# Patient Record
Sex: Female | Born: 1940 | Race: White | Hispanic: No | Marital: Married | Smoking: Never smoker
Health system: Southern US, Community
[De-identification: ages and names within clinical notes are randomized; demographics above are authoritative.]

## PROBLEM LIST (undated history)

## (undated) DIAGNOSIS — E039 Hypothyroidism, unspecified: Secondary | ICD-10-CM

## (undated) DIAGNOSIS — I10 Essential (primary) hypertension: Secondary | ICD-10-CM

## (undated) DIAGNOSIS — K9089 Other intestinal malabsorption: Secondary | ICD-10-CM

## (undated) DIAGNOSIS — R413 Other amnesia: Secondary | ICD-10-CM

## (undated) DIAGNOSIS — E079 Disorder of thyroid, unspecified: Secondary | ICD-10-CM

## (undated) DIAGNOSIS — Z1371 Encounter for nonprocreative screening for genetic disease carrier status: Secondary | ICD-10-CM

## (undated) HISTORY — PX: ABDOMINAL HYSTERECTOMY: SHX81

## (undated) HISTORY — PX: COLONOSCOPY: SHX174

## (undated) HISTORY — DX: Other intestinal malabsorption: K90.89

## (undated) HISTORY — DX: Disorder of thyroid, unspecified: E07.9

## (undated) HISTORY — DX: Essential (primary) hypertension: I10

## (undated) HISTORY — DX: Other amnesia: R41.3

---

## 1997-11-22 ENCOUNTER — Encounter: Payer: Self-pay | Admitting: Obstetrics and Gynecology

## 1997-11-22 ENCOUNTER — Ambulatory Visit (HOSPITAL_COMMUNITY): Admission: RE | Admit: 1997-11-22 | Discharge: 1997-11-22 | Payer: Self-pay | Admitting: Obstetrics and Gynecology

## 1998-11-25 ENCOUNTER — Encounter: Payer: Self-pay | Admitting: Family Medicine

## 1998-11-25 ENCOUNTER — Ambulatory Visit (HOSPITAL_COMMUNITY): Admission: RE | Admit: 1998-11-25 | Discharge: 1998-11-25 | Payer: Self-pay | Admitting: Family Medicine

## 2000-01-04 ENCOUNTER — Ambulatory Visit (HOSPITAL_COMMUNITY): Admission: RE | Admit: 2000-01-04 | Discharge: 2000-01-04 | Payer: Self-pay | Admitting: Obstetrics and Gynecology

## 2000-01-04 ENCOUNTER — Encounter: Payer: Self-pay | Admitting: Internal Medicine

## 2000-01-08 ENCOUNTER — Encounter: Payer: Self-pay | Admitting: Internal Medicine

## 2000-01-08 ENCOUNTER — Encounter: Admission: RE | Admit: 2000-01-08 | Discharge: 2000-01-08 | Payer: Self-pay | Admitting: Family Medicine

## 2000-04-06 ENCOUNTER — Other Ambulatory Visit: Admission: RE | Admit: 2000-04-06 | Discharge: 2000-04-06 | Payer: Self-pay | Admitting: Obstetrics and Gynecology

## 2000-06-10 ENCOUNTER — Encounter: Admission: RE | Admit: 2000-06-10 | Discharge: 2000-06-10 | Payer: Self-pay | Admitting: Obstetrics and Gynecology

## 2000-06-10 ENCOUNTER — Encounter: Payer: Self-pay | Admitting: Obstetrics and Gynecology

## 2001-01-09 ENCOUNTER — Encounter: Payer: Self-pay | Admitting: Internal Medicine

## 2001-01-09 ENCOUNTER — Ambulatory Visit (HOSPITAL_COMMUNITY): Admission: RE | Admit: 2001-01-09 | Discharge: 2001-01-09 | Payer: Self-pay | Admitting: Internal Medicine

## 2001-06-30 ENCOUNTER — Other Ambulatory Visit: Admission: RE | Admit: 2001-06-30 | Discharge: 2001-06-30 | Payer: Self-pay | Admitting: Obstetrics and Gynecology

## 2002-03-06 ENCOUNTER — Encounter: Payer: Self-pay | Admitting: Obstetrics and Gynecology

## 2002-03-06 ENCOUNTER — Ambulatory Visit (HOSPITAL_COMMUNITY): Admission: RE | Admit: 2002-03-06 | Discharge: 2002-03-06 | Payer: Self-pay | Admitting: Obstetrics and Gynecology

## 2003-03-08 ENCOUNTER — Ambulatory Visit (HOSPITAL_COMMUNITY): Admission: RE | Admit: 2003-03-08 | Discharge: 2003-03-08 | Payer: Self-pay | Admitting: Gastroenterology

## 2003-04-01 ENCOUNTER — Ambulatory Visit (HOSPITAL_COMMUNITY): Admission: RE | Admit: 2003-04-01 | Discharge: 2003-04-01 | Payer: Self-pay | Admitting: Internal Medicine

## 2004-04-13 ENCOUNTER — Ambulatory Visit (HOSPITAL_COMMUNITY): Admission: RE | Admit: 2004-04-13 | Discharge: 2004-04-13 | Payer: Self-pay | Admitting: Internal Medicine

## 2005-04-20 ENCOUNTER — Ambulatory Visit (HOSPITAL_COMMUNITY): Admission: RE | Admit: 2005-04-20 | Discharge: 2005-04-20 | Payer: Self-pay | Admitting: Obstetrics and Gynecology

## 2006-02-08 ENCOUNTER — Ambulatory Visit (HOSPITAL_BASED_OUTPATIENT_CLINIC_OR_DEPARTMENT_OTHER): Admission: RE | Admit: 2006-02-08 | Discharge: 2006-02-08 | Payer: Self-pay | Admitting: Orthopedic Surgery

## 2006-05-04 ENCOUNTER — Ambulatory Visit: Payer: Self-pay | Admitting: Pulmonary Disease

## 2006-05-25 ENCOUNTER — Ambulatory Visit (HOSPITAL_COMMUNITY): Admission: RE | Admit: 2006-05-25 | Discharge: 2006-05-25 | Payer: Self-pay | Admitting: Obstetrics and Gynecology

## 2006-06-14 ENCOUNTER — Ambulatory Visit: Payer: Self-pay | Admitting: Pulmonary Disease

## 2007-06-21 ENCOUNTER — Ambulatory Visit (HOSPITAL_COMMUNITY): Admission: RE | Admit: 2007-06-21 | Discharge: 2007-06-21 | Payer: Self-pay | Admitting: Obstetrics and Gynecology

## 2007-07-13 ENCOUNTER — Encounter: Admission: RE | Admit: 2007-07-13 | Discharge: 2007-07-13 | Payer: Self-pay | Admitting: Obstetrics and Gynecology

## 2007-08-22 ENCOUNTER — Encounter: Payer: Self-pay | Admitting: Pulmonary Disease

## 2007-12-12 ENCOUNTER — Telehealth (INDEPENDENT_AMBULATORY_CARE_PROVIDER_SITE_OTHER): Payer: Self-pay | Admitting: *Deleted

## 2007-12-13 ENCOUNTER — Telehealth (INDEPENDENT_AMBULATORY_CARE_PROVIDER_SITE_OTHER): Payer: Self-pay | Admitting: *Deleted

## 2007-12-22 DIAGNOSIS — E039 Hypothyroidism, unspecified: Secondary | ICD-10-CM | POA: Insufficient documentation

## 2007-12-22 DIAGNOSIS — J309 Allergic rhinitis, unspecified: Secondary | ICD-10-CM | POA: Insufficient documentation

## 2007-12-22 DIAGNOSIS — I10 Essential (primary) hypertension: Secondary | ICD-10-CM | POA: Insufficient documentation

## 2007-12-25 ENCOUNTER — Ambulatory Visit: Payer: Self-pay | Admitting: Pulmonary Disease

## 2007-12-25 DIAGNOSIS — J45909 Unspecified asthma, uncomplicated: Secondary | ICD-10-CM | POA: Insufficient documentation

## 2008-03-25 ENCOUNTER — Encounter: Payer: Self-pay | Admitting: Pulmonary Disease

## 2008-07-22 ENCOUNTER — Ambulatory Visit (HOSPITAL_COMMUNITY): Admission: RE | Admit: 2008-07-22 | Discharge: 2008-07-22 | Payer: Self-pay | Admitting: Obstetrics and Gynecology

## 2009-10-27 ENCOUNTER — Telehealth (INDEPENDENT_AMBULATORY_CARE_PROVIDER_SITE_OTHER): Payer: Self-pay | Admitting: *Deleted

## 2009-11-04 ENCOUNTER — Ambulatory Visit: Payer: Self-pay | Admitting: Pulmonary Disease

## 2009-11-04 DIAGNOSIS — F341 Dysthymic disorder: Secondary | ICD-10-CM | POA: Insufficient documentation

## 2009-11-11 ENCOUNTER — Telehealth (INDEPENDENT_AMBULATORY_CARE_PROVIDER_SITE_OTHER): Payer: Self-pay | Admitting: *Deleted

## 2010-02-15 ENCOUNTER — Encounter: Payer: Self-pay | Admitting: Obstetrics and Gynecology

## 2010-02-26 NOTE — Assessment & Plan Note (Signed)
Summary: cough/ok per Alexis Donovan/mg   CC:  last ov  12-25-2007--cough that started out as a head cold---lots of congestion that sits in her throat and she has to clear her throat all the time--cough is mostly dry.  History of Present Illness: 70 y/o WF whom we have seen in the past for Asthma/ RADS... she is the wife of Alexis Donovan... her primary physician is DrGates...    ~  EAV40:  I saw her May08 and she was stable on Symbicort 80- 2 sp Bid, Mucinex 1-2 tabs Bid, Tussionex Prn... she had just finished a brief course of Prednisone for an asthma exac... over the last year she has been pretty good about taking her Symbicort- usually 2 sp Bid but decreases it to 1 sp/d in the summer months when she is free from URIs, allergens, etc...    Presents w/ a several week hx of increasing congestion, wheezing off and on, some cough - all assoc w/ the cooler wetter weather... exposed to grandchildren w/ colds etc... DrGates added ALLEGRA 180 to her regimen and this has helped some...  We discussed a round of Medrol to reduce her airway inflamm and refill meds...   ~  November 04, 2009:  add-on appt at her request w/ hx URI about 1 month ago w/ head congestion, drainage, cough, & phlegm in throat- irritating;   treated w/ Amoxicillin & sl better but c/o persistant inflammatory symptoms & w/ her hx of RADS she prob needs brief course Prednisone for resolution... she hasn't had a CXR in some time, she says- so we will check one for completeness... also notes intermittent recurrent throat symptoms- "I lose my voice" & we discussed MMW for Prn use...    Current Problems:   ALLERGIC RHINITIS (ICD-477.9) INTRINSIC ASTHMA, UNSPECIFIED (ICD-493.10)  ~  baseline CXR= I don't have a baseline CXR on her- she indicated that routine CXR at [facility] office was "clear & WNL"...  ~  prev PFT's 4/08 showed FVC= 2.75 (84%), FEV1= 1.93 (74%), %1sec= 70, mid-flows= 46%... all c/w  mild airflow obstruction/ small airways disease...  ~  CXR here 10/11 showed   HYPERTENSION (ICD-401.9) - on BENICAR 20mg /d per DrGates...  HYPOTHYROIDISM (ICD-244.9) - on SYNTHROID 22mcg/d per DrGates  ANXIETY DEPRESSION (ICD-300.4) - she notes incr family stress & DrGates has placed her on LEXAPRO 10mg /d,  CLONAZEPAM 0.5mg  Prn, & AMBIEN 10mg  Qhs Prn...   Preventive Screening-Counseling & Management  Alcohol-Tobacco     Smoking Status: quit     Year Quit: she smoked a few years in early 102s...  Allergies (verified): No Known Drug Allergies  Comments:  Nurse/Medical Assistant: The patient's medications and allergies were reviewed with the patient and were updated in the Medication and Allergy Lists.  Past History:  Past Medical History: ALLERGIC RHINITIS (ICD-477.9) INTRINSIC ASTHMA, UNSPECIFIED (ICD-493.10) HYPERTENSION (ICD-401.9) HYPOTHYROIDISM (ICD-244.9) ANXIETY DEPRESSION (ICD-300.4)  Past Surgical History: S/P hysterectomy S/P left hand surg 1/08 by DrSypher  Family History: Reviewed history and no changes required.  Social History: Reviewed history and no changes required.  Review of Systems      See HPI       The patient complains of prolonged cough.  The patient denies anorexia, fever, weight loss, weight gain, vision loss, decreased hearing, hoarseness, chest pain, syncope, dyspnea on exertion, peripheral edema, headaches, hemoptysis, abdominal pain, melena, hematochezia, severe indigestion/heartburn, hematuria, incontinence, muscle weakness, suspicious skin lesions, transient blindness, difficulty walking, depression, unusual weight change, abnormal bleeding, enlarged lymph nodes, and angioedema.  Vital Signs:  Patient profile:   70 year old female Height:      67 inches Weight:      166.38 pounds BMI:     26.15 O2 Sat:      97 % on Room air Temp:     97.5 degrees F oral Pulse rate:   79 / minute BP sitting:   118 / 76  (left arm) Cuff size:   regular  Vitals Entered By: Randell Loop CMA  (November 04, 2009 3:11 PM)  O2 Sat at Rest %:  97 O2 Flow:  Room air CC: last ov  12-25-2007--cough that started out as a head cold---lots of congestion that sits in her throat and she has to clear her throat all the time--cough is mostly dry Is Patient Diabetic? No Pain Assessment Patient in pain? no      Comments meds updated today with pt   Physical Exam  Additional Exam:  WD, WN, 70 y/o WF in NAD... GENERAL:  Alert & oriented; pleasant & cooperative... HEENT:  /AT, EOM-wnl, PERRLA, EACs-clear, TMs-wnl, NOSE- sl congested, THROAT-clear & wnl. NECK:  Supple w/ fairROM; no JVD; normal carotid impulses w/o bruits; no thyromegaly or nodules palpated; no lymphadenopathy. CHEST:  Clear to P & A; scat end-exp wheezes; no rales or rhonchi... HEART:  Regular Rhythm; prominent P2, gr 1/6 SEM without rubs or gallops detected... ABDOMEN:  Soft & nontender; normal bowel sounds; no organomegaly or masses palpated... EXT: without deformities or arthritic changes; no varicose veins/ venous insuffic/ or edema. NEURO:  CN's intact; no focal neuro deficits... DERM:  No lesions noted; no rash etc...    CXR  Procedure date:  11/04/2009  Findings:      CHEST - 2 VIEW Comparison: None.   Findings: Trachea is midline.  Heart is at the upper limits of normal in size.  Minimal biapical pleural thickening.  Lungs are clear.  No pleural fluid.   IMPRESSION: No acute findings.   Read By:  Reyes Ivan.,  M.D.  Impression & Recommendations:  Problem # 1:  INTRINSIC ASTHMA, UNSPECIFIED (ICD-493.10) She has RADS/ Asthma & her main trigger appears to be her infreq URI's... we discussed Rx w/ DepoMedrol, & Prednisone taper for the residual airway inflammation... plus she will try the MMW for intermittent thoat symptoms she has... Her updated medication list for this problem includes:    Symbicort 80-4.5 Mcg/act Aero (Budesonide-formoterol fumarate) ..... Inhale 2 puffs two times a day     Proventil Hfa 108 (90 Base) Mcg/act Aers (Albuterol sulfate) .Marland Kitchen... 1-2 puffs every 4-6 hours as needed    Prednisone 20 Mg Tabs (Prednisone) .Marland Kitchen... Take 1 tab by mouth two times a day x3days, then 1tab daily x3days, then 1/2 tab daily x3days, then 1/2 tab every other day til gone...  Orders: T-2 View CXR (71020TC) >> clear lungs...  Problem # 2:  HYPERTENSION (ICD-401.9) Controlled on med from [facility]... Her updated medication list for this problem includes:    Benicar 20 Mg Tabs (Olmesartan medoxomil) .Marland Kitchen... Take 1 tab daily as directed by drgates  Problem # 3:  HYPOTHYROIDISM (ICD-244.9) Clinically euthyroid on the Synthroid dose from DrGates... Her updated medication list for this problem includes:    Synthroid 88 Mcg Tabs (Levothyroxine sodium) .Marland Kitchen... Take 1 tablet by mouth once a day  Problem # 4:  ANXIETY DEPRESSION (ICD-300.4) Currently using Lexapro, Clonazepam, & Ambien as directed by DrGates...  Complete Medication List: 1)  Allegra 180 Mg Tabs (Fexofenadine hcl) .Marland KitchenMarland KitchenMarland Kitchen  Take 1 tab by mouth once daily for allergies.Marland KitchenMarland Kitchen 2)  Symbicort 80-4.5 Mcg/act Aero (Budesonide-formoterol fumarate) .... Inhale 2 puffs two times a day 3)  Proventil Hfa 108 (90 Base) Mcg/act Aers (Albuterol sulfate) .Marland Kitchen.. 1-2 puffs every 4-6 hours as needed 4)  Benicar 20 Mg Tabs (Olmesartan medoxomil) .... Take 1 tab daily as directed by drgates 5)  Synthroid 88 Mcg Tabs (Levothyroxine sodium) .... Take 1 tablet by mouth once a day 6)  Premarin 0.3 Mg Tabs (Estrogens conjugated) .... Take as directed by gyn... 7)  Lexapro 10 Mg Tabs (Escitalopram oxalate) .... Take 1 tablet by mouth once a day 8)  Clonazepam 0.5 Mg Tabs (Clonazepam) .... Take 1/2 tablet by mouth at bedtime 9)  Ambien 10 Mg Tabs (Zolpidem tartrate) .... Take 1/2 tabet by mouth at bedtime 10)  Prednisone 20 Mg Tabs (Prednisone) .... Take 1 tab by mouth two times a day x3days, then 1tab daily x3days, then 1/2 tab daily x3days, then 1/2 tab every other  day til gone... 11)  Magic Mouthwash  .... 1 tsp gargle & swallow up to 4 times daily as needed...  Other Orders: Depo- Medrol 80mg  (J1040) Depo- Medrol 40mg  (J1030) Admin of Therapeutic Inj  intramuscular or subcutaneous (96045)  Patient Instructions: 1)  Today we updated your med list- see below.... 2)  We decided to treat your airway inflammation w/ a Depo shot & a course of Prednisone- follow the directions on the bottle.Marland KitchenMarland Kitchen 3)  We also wrote a new perscription for MAGIC MOUTHWASH- 1 tsp gargle & swallow up to 4 times daily as needed for the throat symptoms.Marland KitchenMarland Kitchen 4)  You may also benefit from [facility] 1-2 two times a day w/ plenty of fluids for any congestion & thick mucous.Marland KitchenMarland Kitchen 5)  Today we did a CXR to be sure there are no abnormalities here- please call the "phone tree" for your results.Marland KitchenMarland Kitchen  6)  Call for any questions... Prescriptions: MAGIC MOUTHWASH 1 tsp gargle & swallow up to 4 times daily as needed...  #4 oz x prn   Entered and Authorized by:   Michele Mcalpine MD   Signed by:   Michele Mcalpine MD on 11/04/2009   Method used:   Print then Give to Patient   RxID:   [id number] PREDNISONE 20 MG TABS (PREDNISONE) take 1 tab by mouth two times a day x3days, then 1tab daily x3days, then 1/2 tab daily x3days, then 1/2 tab every other day til gone...  #12 x 0   Entered and Authorized by:   Michele Mcalpine MD   Signed by:   Michele Mcalpine MD on 11/04/2009   Method used:   Print then Give to Patient   RxID:   [id number]    Immunization History:  Influenza Immunization History:    Influenza:  historical (11/01/2009)   Medication Administration  Injection # 1:    Medication: Depo- Medrol 80mg     Diagnosis: ALLERGIC RHINITIS (ICD-477.9)    Route: IM    Site: LUOQ gluteus    Exp Date: 04/2012    Lot #: [id number]    Mfr: Pharmacia    Patient tolerated injection without complications    Given by: Randell Loop CMA (November 04, 2009 4:18 PM)  Injection # 2:    Medication: Depo-  Medrol 40mg     Diagnosis: ALLERGIC RHINITIS (ICD-477.9)    Route: IM    Site: LUOQ gluteus    Exp Date: 04/2012    Lot #: [id number]  Mfr: Pharmacia    Patient tolerated injection without complications    Given by: Randell Loop CMA (November 04, 2009 4:18 PM)  Orders Added: 1)  Est. Patient Level IV [29562] 2)  T-2 View CXR [71020TC] 3)  Depo- Medrol 80mg  [J1040] 4)  Depo- Medrol 40mg  [J1030] 5)  Admin of Therapeutic Inj  intramuscular or subcutaneous [13086]

## 2010-02-26 NOTE — Progress Notes (Signed)
Summary: RESULTS of cxr-----on phone tree  Phone Note Call from Patient   Caller: Patient Call For: NADEL Summary of Call: PT CALLING FOR CHEST XRAY RESULTS Initial call taken by: Rickard Patience,  November 11, 2009 10:34 AM  Follow-up for Phone Call        per append to cxr results from 11/05/2009, results are on phone tree.  called and spoke with pt and informed her of the above information and transferred pt to the phone tree line. Arman Filter LPN  November 11, 2009 10:58 AM

## 2010-02-26 NOTE — Progress Notes (Signed)
Summary: work in [employer] Note Call from Patient   Caller: pt Summary of Call: Pt is requesting an appt with Dr. Kriste Basque sooner then the available December appts.  Pt last seen by Dr. Kriste Basque 11-2007. 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 Thanks.  Initial call taken by: Carron Curie CMA,  October 27, 2009 10:06 AM  Follow-up for Phone Call        need to know if the pt is sick or if something else is going on please. thanks Randell Loop CMA  October 27, 2009 10:08 AM      Additional Follow-up for Phone Call Additional follow up Details #1::        Spoke with pt's spouse.  He states that pt wants to be seen sooner for cough that has bothered her for 4 wks- already txed with amoxcicillin and still not improved.  She wants appt pls advise Additional Follow-up by: Vernie Murders,  October 27, 2009 10:55 AM    Additional Follow-up for Phone Call Additional follow up Details #2::    we can add pt on for 10-10 at 10:30    thanks Randell Loop CMA  October 27, 2009 11:49 AM   Spoke with pt's spouse and notified of appt date and time- appt sched. Follow-up by: Vernie Murders,  October 27, 2009 11:59 AM

## 2010-02-26 NOTE — Progress Notes (Signed)
Summary: Alexis Donovan ANOTHER APPT DATE/ WORK-IN  Phone Note Call from Patient Call back at [facility] Phone [phone]   Caller: Patient Call For: NADEL Summary of Call: PT CAN'T COME 10/10 (WILL BE AT [facility]). CALL TO GIVE HER ANOTHER APPT DATE. (COUGH) SEE LAST EMR MSG Initial call taken by: Tivis Ringer, CNA,  October 27, 2009 12:12 PM  Follow-up for Phone Call        we can change her app to  10-11 at 3pm  this would be the earliest appt aval.  or she could have Dr. Kevan Ny refer her to a pulmonary doc and possibly get her in sooner.  thanks Randell Loop CMA  October 27, 2009 12:16 PM \\par   called and spoke with pt.  pt scheduled to see SN 11/04/2009 at 3pm. Arman Filter LPN  October 27, 2009 12:21 PM

## 2010-06-12 NOTE — Op Note (Signed)
[name], [name]              ACCOUNT NO.:  [id number]   MEDICAL RECORD NO.:  [id number]          PATIENT TYPE:  AMB   LOCATION:  DSC                          FACILITY:  [facility]   PHYSICIAN:  Katy Fitch. Sypher, M.D. 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   OPERATION:  Release of left thumb A1 pulley.   SURGEON:  Josephine Igo, M.D.   ASSISTANT:  Molly Maduro Dasnoit PA-C.   ANESTHESIA:  Is general sedation and 2% lidocaine metacarpal head level  block of left thumb, supervising anesthesiologist is Dr. Krista Blue.   INDICATIONS:  Alexis Donovan is a 70 year old woman who self-referred  for management of a locking left trigger thumb.   Clinical examination revealed signs of stenosing tenosynovitis at the  left thumb A1 pulley.  She is not responding to nonoperative measures,  therefore she presents for release of her left thumb A1 pulley at this  time.   DESCRIPTION OF PROCEDURE:  Alexis Donovan is brought to the operating  room and placed in supine position on the table.   Following light sedation left arm was prepped with Betadine soap  solution, sterilely draped.  A pneumatic tourniquet was applied to  proximal left brachium.  Following exsanguination left arm with Esmarch  bandage arterial tourniquet inflated to 250 mmHg.  Procedure commenced  with a short transverse scission directly over the thickened A1 pulley.  The subcutaneous tissues were carefully divided taking care to identify  and retract the radial proper digital nerve.  Pulley was split with  scalpel and scissors and the tendon delivered.  Thereafter full active  range of motion of the IP joint was recovered.   No mass or other predicaments were noted.   The  wound was repaired with mattress suture of 5-0 nylon.  A compressive  dressing was applied with Xeroflo, sterile gauze, and Ace wrap.      Katy Fitch Sypher, M.D.  Electronically Signed     RVS/MEDQ  D:  02/08/2006  T:  02/09/2006  Job:  [id number]   cc:   Candyce Churn, M.D.

## 2011-01-06 ENCOUNTER — Other Ambulatory Visit (HOSPITAL_COMMUNITY): Payer: Self-pay | Admitting: Obstetrics and Gynecology

## 2011-01-06 DIAGNOSIS — Z13820 Encounter for screening for osteoporosis: Secondary | ICD-10-CM

## 2011-01-06 DIAGNOSIS — Z1231 Encounter for screening mammogram for malignant neoplasm of breast: Secondary | ICD-10-CM

## 2011-02-17 ENCOUNTER — Ambulatory Visit (HOSPITAL_COMMUNITY)
Admission: RE | Admit: 2011-02-17 | Discharge: 2011-02-17 | Disposition: A | Discharge status: Home / Self Care | Payer: Medicare Other | Source: Ambulatory Visit | Attending: Obstetrics and Gynecology | Admitting: Obstetrics and Gynecology

## 2011-02-17 DIAGNOSIS — Z13820 Encounter for screening for osteoporosis: Secondary | ICD-10-CM

## 2011-02-17 DIAGNOSIS — Z1231 Encounter for screening mammogram for malignant neoplasm of breast: Secondary | ICD-10-CM

## 2011-02-23 ENCOUNTER — Other Ambulatory Visit: Payer: Self-pay | Admitting: Obstetrics and Gynecology

## 2011-02-23 DIAGNOSIS — R928 Other abnormal and inconclusive findings on diagnostic imaging of breast: Secondary | ICD-10-CM

## 2011-03-05 ENCOUNTER — Ambulatory Visit
Admission: RE | Admit: 2011-03-05 | Discharge: 2011-03-05 | Disposition: A | Discharge status: Home / Self Care | Payer: No Typology Code available for payment source | Source: Ambulatory Visit | Attending: Obstetrics and Gynecology | Admitting: Obstetrics and Gynecology

## 2011-03-05 DIAGNOSIS — R928 Other abnormal and inconclusive findings on diagnostic imaging of breast: Secondary | ICD-10-CM

## 2011-03-27 ENCOUNTER — Telehealth: Payer: Self-pay

## 2011-03-27 ENCOUNTER — Ambulatory Visit (INDEPENDENT_AMBULATORY_CARE_PROVIDER_SITE_OTHER): Payer: Medicare Other | Admitting: Internal Medicine

## 2011-03-27 VITALS — BP 108/67 | HR 74 | Temp 98.5°F | Resp 18 | Ht 67.5 in | Wt 163.0 lb

## 2011-03-27 DIAGNOSIS — J209 Acute bronchitis, unspecified: Secondary | ICD-10-CM

## 2011-03-27 DIAGNOSIS — J040 Acute laryngitis: Secondary | ICD-10-CM

## 2011-03-27 MED ORDER — AZITHROMYCIN 250 MG PO TABS: ORAL_TABLET | ORAL | Status: AC

## 2011-03-27 MED ORDER — PREDNISONE 20 MG PO TABS: ORAL_TABLET | ORAL | Status: DC

## 2011-03-27 NOTE — Progress Notes (Signed)
  Subjective:    Patient ID: Alexis Donovan, female    DOB: Mar 07, 1940, 71 y.o.   MRN: [id number]  HPIPresents with a one-week history of congestion that has progressed to laryngitis over the last 4 days with a cough that is day and night and is somewhat painful. She has had past history of bronchitis with reactive airway disease. There is no current fever. She is currently stressed by her daughter's admission to the hospital with metastatic breast disease at age 70. There is no sore throat but there is a lot of congestion and mucus deep in the pharynx.    Review of SystemsNo new symptoms related to her current problem list     Objective:   Physical Exam  Vital signs are stable Pupils are equal round and reactive to light TMs are clear The nares are clear Oropharynx clear There are no anterior cervical nodes or thyromegaly The voice is very hoarse The lungs are clear to auscultation and there is very mild wheezing and forced expiration       Assessment & Plan:  Problem #1 laryngitis Problem #2 LRI with reactive airway disease  Z-Pak 250 Prednisone 40 mg daily for 3 days OTC cough meds Followup in 5 days if not much improved

## 2011-03-27 NOTE — Telephone Encounter (Signed)
Patient called because she saw Dr. Merla Riches this am. Rx was sent to [facility] on [address], but it was a Z-pac. Pt thought she was getting Prednisone and another antibiotic but got a Zpac instead. Wanted to let us know that in the past when she has taken a z-pac, she feels better for a couple of days, then bad after that. Would like to see if there is something else that can be called in today.

## 2011-03-28 NOTE — Telephone Encounter (Signed)
Pt called again to check on status of message.

## 2011-03-29 NOTE — Telephone Encounter (Signed)
See chart Prednisone was prescribed And zpak was what I intended

## 2011-03-30 NOTE — Telephone Encounter (Signed)
LMOM to CB. 

## 2011-03-31 NOTE — Telephone Encounter (Signed)
LMOM to CB if she still has questions about her Rxs

## 2011-04-01 NOTE — Telephone Encounter (Signed)
Spoke with pt who reports that she did finish the pred and z-pack, and her voice has improved, although she is not well yet. She stated she thinks MD had given Abx to prevent bronchitis that she is susceptible to getting, and it has not progressed to that. She will CB if Sxs persist of certainly if it worsens.

## 2012-04-10 ENCOUNTER — Ambulatory Visit (INDEPENDENT_AMBULATORY_CARE_PROVIDER_SITE_OTHER): Payer: Medicare Other | Admitting: Emergency Medicine

## 2012-04-10 ENCOUNTER — Ambulatory Visit: Payer: Medicare Other

## 2012-04-10 VITALS — BP 124/74 | HR 72 | Temp 98.4°F | Resp 18 | Ht 68.0 in | Wt 172.0 lb

## 2012-04-10 DIAGNOSIS — S93609A Unspecified sprain of unspecified foot, initial encounter: Secondary | ICD-10-CM

## 2012-04-10 DIAGNOSIS — S93602A Unspecified sprain of left foot, initial encounter: Secondary | ICD-10-CM

## 2012-04-10 DIAGNOSIS — S82409A Unspecified fracture of shaft of unspecified fibula, initial encounter for closed fracture: Secondary | ICD-10-CM

## 2012-04-10 DIAGNOSIS — W102XXA Fall (on)(from) incline, initial encounter: Secondary | ICD-10-CM

## 2012-04-10 DIAGNOSIS — S82402A Unspecified fracture of shaft of left fibula, initial encounter for closed fracture: Secondary | ICD-10-CM

## 2012-04-10 DIAGNOSIS — M25579 Pain in unspecified ankle and joints of unspecified foot: Secondary | ICD-10-CM

## 2012-04-10 NOTE — Progress Notes (Signed)
  Subjective:    Patient ID: Alexis Donovan, female    DOB: December 31, 1940, 72 y.o.   MRN: [id number]  HPI patient was in good health until Sunday when while walking down a hill she suffered an inversion injury to right ankle. She has been able to ambulate but has noted significant swelling over the fibula    Review of Systems     Objective:   Physical Exam there is tenderness and swelling over the distal fibula. There is mild tenderness over the deltoid.   [facility] reading (PRIMARY) by  Dr. Cleta Alberts spiral fracture distal fibula.        Assessment & Plan:     Patient has spiral fracture the distal fibula. She will be placed in a cam boot and on crutches follow up with orthopedist  Fitted and trained for crutches. Also applied camwalker boot.

## 2012-06-22 ENCOUNTER — Other Ambulatory Visit (HOSPITAL_COMMUNITY): Payer: Self-pay | Admitting: Internal Medicine

## 2012-06-22 DIAGNOSIS — Z1231 Encounter for screening mammogram for malignant neoplasm of breast: Secondary | ICD-10-CM

## 2012-06-26 ENCOUNTER — Ambulatory Visit (HOSPITAL_COMMUNITY)
Admission: RE | Admit: 2012-06-26 | Discharge: 2012-06-26 | Disposition: A | Discharge status: Home / Self Care | Payer: Medicare Other | Source: Ambulatory Visit | Attending: Internal Medicine | Admitting: Internal Medicine

## 2012-06-26 DIAGNOSIS — Z1231 Encounter for screening mammogram for malignant neoplasm of breast: Secondary | ICD-10-CM | POA: Insufficient documentation

## 2013-08-06 ENCOUNTER — Telehealth: Payer: Self-pay | Admitting: Pulmonary Disease

## 2013-08-06 NOTE — Telephone Encounter (Signed)
Pt requesting appt Pt c/o wheezing, congestion, cough Denies fever Pt states that this is not a "sick" issue, this is something recurrent in her lungs   Using Symbicort as directed. (followed by Dr Inda Merlin PCP)  Pt last seen by [facility] 2011 Aware that she would be classified as a new patient   Pt states that she spoke with PCP Dr Inda Merlin regarding these issues and he advised her to see an Allergist as he could not do anything more for her for these symptoms Pt refused appt with Allergist stating this she wanted to be seen by Pulmonologist as this is something to do with her lungs. She also stated that if this were something dealing with her allergies she would make this appt but it is not so she requests to see SN asap  Aware that SN not in office until 08/07/13 Pt requests leave detailed message on home #  Please advise Dr Lenna Gilford. Thanks.   We will have to have this approved by Dr Lenna Gilford before we can schedule an appt d/t SN not taking new patients at this time.

## 2013-08-06 NOTE — Telephone Encounter (Signed)
Pt called back stating that she is taking a Claritin every day and does not feel this is allergies.

## 2013-08-06 NOTE — Telephone Encounter (Signed)
Pt added on as Consult 08/10/13 at 12pm Will send to [facility] as FYI Nothing further needed.

## 2013-08-06 NOTE — Telephone Encounter (Signed)
Per SN: okay to add on for appt for pulm wed, thur, or Friday when available

## 2013-08-10 ENCOUNTER — Encounter (INDEPENDENT_AMBULATORY_CARE_PROVIDER_SITE_OTHER): Payer: Self-pay

## 2013-08-10 ENCOUNTER — Encounter: Payer: Self-pay | Admitting: Pulmonary Disease

## 2013-08-10 ENCOUNTER — Ambulatory Visit (INDEPENDENT_AMBULATORY_CARE_PROVIDER_SITE_OTHER): Payer: Medicare Other | Admitting: Pulmonary Disease

## 2013-08-10 ENCOUNTER — Ambulatory Visit (INDEPENDENT_AMBULATORY_CARE_PROVIDER_SITE_OTHER)
Admission: RE | Admit: 2013-08-10 | Discharge: 2013-08-10 | Disposition: A | Discharge status: Home / Self Care | Payer: Medicare Other | Source: Ambulatory Visit | Attending: Pulmonary Disease | Admitting: Pulmonary Disease

## 2013-08-10 VITALS — BP 124/82 | HR 72 | Temp 97.4°F | Ht 68.0 in | Wt 165.4 lb

## 2013-08-10 DIAGNOSIS — J45909 Unspecified asthma, uncomplicated: Secondary | ICD-10-CM

## 2013-08-10 DIAGNOSIS — J683 Other acute and subacute respiratory conditions due to chemicals, gases, fumes and vapors: Secondary | ICD-10-CM

## 2013-08-10 DIAGNOSIS — F341 Dysthymic disorder: Secondary | ICD-10-CM

## 2013-08-10 DIAGNOSIS — I10 Essential (primary) hypertension: Secondary | ICD-10-CM

## 2013-08-10 DIAGNOSIS — E039 Hypothyroidism, unspecified: Secondary | ICD-10-CM

## 2013-08-10 MED ORDER — PANTOPRAZOLE SODIUM 40 MG PO TBEC: 40.0000 mg | DELAYED_RELEASE_TABLET | Freq: Every day | ORAL | Status: DC

## 2013-08-10 MED ORDER — BUDESONIDE-FORMOTEROL FUMARATE 160-4.5 MCG/ACT IN AERO: 2.0000 | INHALATION_SPRAY | Freq: Two times a day (BID) | RESPIRATORY_TRACT | Status: AC

## 2013-08-10 MED ORDER — PREDNISONE 20 MG PO TABS: ORAL_TABLET | ORAL | Status: DC

## 2013-08-10 MED ORDER — METHYLPREDNISOLONE ACETATE 80 MG/ML IJ SUSP
80.0000 mg | Freq: Once | INTRAMUSCULAR | Status: AC
  Administered 2013-08-10: 80 mg via INTRAMUSCULAR

## 2013-08-10 NOTE — Patient Instructions (Signed)
Today we updated your med list in our EPIC system...    Continue your current medications the same...  Today we discussed your reactive airways disease and checked a CXR & PFT...    We will contact you w/ the results when available...   We discussed your triggers and it seems like silent reflux may be a significant factor causing your throat & resp symptoms...  In this regard- we want to start a vigorous ANTIREFLUX regimen>>    Start PROTONIX (Pantoprazole) 40mg  tabs- take one tab about 30 min before the evening meal...    DO NOT EAT OR DRINK MUCH AFTER DINNER IN THE EVE (this allows your upper GI tract to empty out before bedtime).    Elevate the head of your bed on 6" blocks (this is in fact a very important step)  For your airway inflammation>>    We are going to treat you with a short course of Prednisone 20mg  tabs>>       Start tomorrow AM w/ one tab twice daily for 4 days...       Then decr to one tab each AM for 4 days...        Then decr to 1/2 tab each AM for 4 days...       Then decr to 1/2 tab every other day til gone...  We are also going to increase your Symbicort to the 160 dose inhaler- and use it as 2 inhalations twice daily on a regular dosing schedule for now...  You may continue the OTC MUCNEX 600mg  tabs- 1to2 tabs twice daily w/ fluids...    And you may use the OTC DELSYM- 2 tsp twice daily as needed for cough...  Call for any questions...  Let's plan a follow up visit in 50mo, sooner if needed for problems.Marland KitchenMarland Kitchen

## 2013-08-10 NOTE — Progress Notes (Signed)
Subjective:     Patient ID: Alexis Donovan, female   DOB: 09-22-1940, 73 y.o.   MRN: [id number]  HPI 73 y/o WF whom we have seen in the past for Asthma/ RADS... she is the wife of Camdyn Laden... her primary physician is DrGates...   ~  [id number]:  I saw her May08 and she was stable on Symbicort 80- 2 sp Bid, Mucinex 1-2 tabs Bid, Tussionex Prn... she had just finished a brief course of Prednisone for an asthma exac... over the last year she has been pretty good about taking her Symbicort- usually 2 sp Bid but decreases it to 1 sp/d in the summer months when she is free from URIs, allergens, etc...    Presents w/ a several week hx of increasing congestion, wheezing off and on, some cough - all assoc w/ the cooler wetter weather... exposed to grandchildren w/ colds etc... DrGates added ALLEGRA 180 to her regimen and this has helped some...  We discussed a round of Medrol to reduce her airway inflamm and refill meds...  ~  November 04, 2009:  add-on appt at her request w/ hx URI about 1 month ago w/ head congestion, drainage, cough, & phlegm in throat- irritating;   treated w/ Amoxicillin & sl better but c/o persistant inflammatory symptoms & w/ her hx of RADS she prob needs brief course Prednisone for resolution... she hasn't had a CXR in some time, she says- so we will check one for completeness... also notes intermittent recurrent throat symptoms- "I lose my voice" & we discussed MMW for Prn use...  ~  August 10, 2013:  41yr ROV & Nitzia called requesting a new appt for persistent resp symptoms> she relates the onset of this prob back last Sept when she had a typical URI/ bronchitis that was refractory to management & required several appts w/ DrGates and left her w/ intermittent cough, beige sput, chest congestion, irritated throat & hoarseness; this has waxed & waned up to the present but she notes "I'm not sick" and "I breathe fine thru my nose" but notes the intermit congestion, cough, hoarseness, etc; she  notes a dry cough at night and notes occas cough after eating but specifically denies heartburn, acid reflux, etc... Exam showed scat bilat rhonchi & chest congestion;  We decided to check PFT & CXR;  We reviewed the following medical problems during today's office visit >>     RADS, Asthma> she has both Symbicort 80 & 160 but not using either one regularly "just as needed for flair-ups"; as above- she has a number of upper airway inflamm symptoms...    HBP> on BenicarHCT 40-25; BP= 124/82 7 similar at home she says; denies HA, visual sx, CP, palpit, SOB, edema...    Hypothyroid> on Synthroid112; followed by [facility] and she is clinically euthyroid on this dose...    Anxiety/ Depression> followed by DrGates on Lexapro10-1/2, Klonopin0.5prn, Ambien10-1/2... We reviewed prob list, meds, xrays and labs> see below for updates >>   CXR 7/15 showed norm heart size, sl tortuous Ao, clear lungs, NAD...   PFT 7/15 revealed FVC=2.67 (80%); FEV1=1.89 (75%), %1sec=71; mid-flows=56% predicted... 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 In this regard we will treat the airway inflamm process w/ Depo80, Pred20mg - 4d tapering sched, Symbicort160-2spBid, along w/ prn Mucinex, Fluids, Delsym;  For the reflux component we discussed a vigorous antireflux regimen- Protonix40 taken 12min before the eve meal, NPO after dinner, elev HOB 6" blocks... We will recheck her in 18mo...            Problem List:  Her Primary physician is DrGates...  ALLERGIC RHINITIS  (ICD-477.9) INTRINSIC ASTHMA, UNSPECIFIED (ICD-493.10) REACTIVE AIRWAYS DYSFUNCTION SYNDROME >>  ~  baseline CXR= I don't have a baseline CXR on her- she indicated that routine CXR at [facility] office was "clear & WNL"... ~  prev PFT's 4/08 showed FVC= 2.75 (84%), FEV1= 1.93 (74%), %1sec= 70, mid-flows= 46%... all c/w  mild airflow obstruction/ small airways disease... ~  CXR here 10/11 showed borderline heart size, min apical pleural thickening, clear, NAD.Marland Kitchen.  ~  7/15: she present ed w/ chronic persistent airway inflamm symptoms (as above); treated w/ Depo/ Pred, Symbicort160, and vigorous antireflux regimen...   HYPERTENSION (ICD-401.9) - on BenicarHCT 40-25; BP= 124/82 7 similar at home she says; denies HA, visual sx, CP, palpit, SOB, edema...  HYPOTHYROIDISM (ICD-244.9) - on Synthroid112; followed by DrGates and she is clinically euthyroid on this dose...  ANXIETY DEPRESSION (ICD-300.4) - followed by DrGates on Lexapro10-1/2, Klonopin0.5prn, Ambien10-1/2...   Past Surgical History  Procedure Laterality Date  . Abdominal hysterectomy      Family History  Problem Relation Age of Onset  . Heart disease Mother   . Heart disease Father   . Heart disease Maternal Grandfather   . Heart disease Paternal Grandfather   . Cancer Daughter     breast    History   Social History  . Marital Status: Married    Spouse Name: N/A    Number of Children: N/A  . Years of Education: N/A   Occupational History  . Not on file.   Social History Main Topics  . Smoking status: Never Smoker   . Smokeless tobacco: Not on file  . Alcohol Use: Yes  . Drug Use: No  . Sexual Activity: Yes   Other Topics Concern  . Not on file   Social History Narrative  . No narrative on file    Outpatient Encounter Prescriptions as of 08/10/2013  Medication Sig  . clonazePAM (KLONOPIN) 0.5 MG tablet Take 0.5 mg by mouth as needed for anxiety.  Marland Kitchen escitalopram (LEXAPRO) 10 MG tablet Take 5 mg by mouth daily.    Marland Kitchen levothyroxine (SYNTHROID, LEVOTHROID) 112 MCG tablet Take 112 mcg by mouth daily.  . Multiple Vitamin (MULTIVITAMIN) tablet Take 1 tablet by mouth daily.  Marland Kitchen olmesartan-hydrochlorothiazide (BENICAR HCT) 40-25 MG per tablet Take 1 tablet by mouth daily.  Marland Kitchen zolpidem (AMBIEN) 10 MG tablet Take 5 mg by mouth at bedtime as needed.  . budesonide-formoterol (SYMBICORT) 160-4.5 MCG/ACT inhaler Inhale 2 puffs into the lungs 2 (two) times daily.  Marland Kitchen estrogens, conjugated, (PREMARIN) 0.3 MG tablet Take 0.3 mg by mouth daily. Take daily for 21 days then do not take for 7 days.  . pantoprazole (PROTONIX) 40 MG tablet Take 1 tablet (40 mg total) by mouth daily. 30 minutes before the evening meal  . predniSONE (DELTASONE) 20 MG tablet Take as directed    No Known Allergies   Current Medications, Allergies, Past Medical History, Past Surgical History,  Family History, and Social History were reviewed in [employer] record.   Review of Systems         See HPI      The patient complains of prolonged cough.  The patient denies anorexia, fever, weight loss, weight gain, vision loss, decreased hearing, hoarseness, chest pain, syncope, dyspnea on exertion, peripheral edema, headaches, hemoptysis, abdominal pain, melena, hematochezia, severe indigestion/heartburn, hematuria, incontinence, muscle weakness, suspicious skin lesions, transient blindness, difficulty walking, depression, unusual weight change, abnormal bleeding, enlarged lymph nodes, and angioedema.     Objective:   Physical Exam    WD, WN, 73 y/o WF in NAD... GENERAL:  Alert & oriented; pleasant & cooperative... HEENT:  [place]/AT, EOM-wnl, PERRLA, EACs-clear, TMs-wnl, NOSE- sl congested, THROAT-clear & wnl. NECK:  Supple w/ fairROM; no JVD; normal carotid impulses w/o bruits; no thyromegaly or nodules palpated; no lymphadenopathy. CHEST:  Clear to P & A; scat end-exp wheezes; no rales or rhonchi... HEART:  Regular Rhythm;  prominent P2, gr 1/6 SEM without rubs or gallops detected... ABDOMEN:  Soft & nontender; normal bowel sounds; no organomegaly or masses palpated... EXT: without deformities or arthritic changes; no varicose veins/ venous insuffic/ or edema. NEURO:  CN's intact; no focal neuro deficits... DERM:  No lesions noted; no rash etc...  RADIOLOGY DATA:  Reviewed in the EPIC EMR & discussed w/ the patient...  LABORATORY DATA:  Reviewed in the EPIC EMR & discussed w/ the patient...   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  ROV recheck in 3mo...   HBP> continue meds from DrGates  Hypothyroid> continue meds from [facility]...  Anxiety/ Depression> continue meds from DrGates...      Plan:     Patient's Medications  New Prescriptions   BUDESONIDE-FORMOTEROL (SYMBICORT) 160-4.5 MCG/ACT INHALER    Inhale 2 puffs into the lungs 2 (two) times daily.   PANTOPRAZOLE (PROTONIX) 40 MG TABLET    Take 1 tablet (40 mg total) by mouth daily. 30 minutes before the evening meal   PREDNISONE (DELTASONE) 20 MG TABLET    Take as directed  Previous Medications   CLONAZEPAM (KLONOPIN) 0.5 MG TABLET    Take 0.5 mg by mouth as needed for anxiety.   ESCITALOPRAM (LEXAPRO) 10 MG TABLET    Take 5 mg by mouth daily.    ESTROGENS, CONJUGATED, (PREMARIN) 0.3 MG TABLET    Take 0.3 mg by mouth daily. Take daily for 21 days then do not take for 7 days.   LEVOTHYROXINE (SYNTHROID, LEVOTHROID) 112 MCG TABLET    Take 112 mcg by mouth daily.   MULTIPLE VITAMIN (MULTIVITAMIN) TABLET    Take 1 tablet by mouth daily.   OLMESARTAN-HYDROCHLOROTHIAZIDE (BENICAR HCT) 40-25 MG PER TABLET    Take 1 tablet by mouth  daily.   ZOLPIDEM (AMBIEN) 10 MG TABLET    Take 5 mg by mouth at bedtime as needed.  Modified Medications   No medications on file  Discontinued Medications   BUDESONIDE-FORMOTEROL (SYMBICORT) 80-4.5 MCG/ACT INHALER    Inhale 2 puffs into the lungs 2 (two) times daily.   BUPROPION (WELLBUTRIN SR) 150 MG 12 HR TABLET    Take 150 mg by mouth  daily.   CALCIUM CARBONATE (OS-CAL) 600 MG TABS    Take 600 mg by mouth daily.   CITALOPRAM (CELEXA) 20 MG TABLET    Take 20 mg by mouth daily.   PREDNISONE (DELTASONE) 20 MG TABLET    2 tabs daily for 3 days

## 2013-09-12 ENCOUNTER — Ambulatory Visit (INDEPENDENT_AMBULATORY_CARE_PROVIDER_SITE_OTHER): Payer: Medicare Other | Admitting: Pulmonary Disease

## 2013-09-12 ENCOUNTER — Encounter: Payer: Self-pay | Admitting: Pulmonary Disease

## 2013-09-12 VITALS — BP 102/80 | HR 65 | Temp 97.4°F | Ht 68.0 in | Wt 165.0 lb

## 2013-09-12 DIAGNOSIS — J683 Other acute and subacute respiratory conditions due to chemicals, gases, fumes and vapors: Secondary | ICD-10-CM

## 2013-09-12 DIAGNOSIS — J45909 Unspecified asthma, uncomplicated: Secondary | ICD-10-CM

## 2013-09-12 NOTE — Progress Notes (Signed)
Subjective:     Patient ID: Alexis Donovan, female   DOB: 12-01-1940, 72 y.o.   MRN: [id number]  HPI 73 y/o WF whom we have seen in the past for Asthma/ RADS... she is the wife of Alexis Donovan... her primary physician is DrGates...   ~  NTI14:  I saw her May08 and she was stable on Symbicort 80- 2 sp Bid, Mucinex 1-2 tabs Bid, Tussionex Prn... she had just finished a brief course of Prednisone for an asthma exac... over the last year she has been pretty good about taking her Symbicort- usually 2 sp Bid but decreases it to 1 sp/d in the summer months when she is free from URIs, allergens, etc...    Presents w/ a several week hx of increasing congestion, wheezing off and on, some cough - all assoc w/ the cooler wetter weather... exposed to grandchildren w/ colds etc... DrGates added ALLEGRA 180 to her regimen and this has helped some...  We discussed a round of Medrol to reduce her airway inflamm and refill meds...  ~  November 04, 2009:  add-on appt at her request w/ hx URI about 1 month ago w/ head congestion, drainage, cough, & phlegm in throat- irritating;   treated w/ Amoxicillin & sl better but c/o persistant inflammatory symptoms & w/ her hx of RADS she prob needs brief course Prednisone for resolution... she hasn't had a CXR in some time, she says- so we will check one for completeness... also notes intermittent recurrent throat symptoms- "I lose my voice" & we discussed MMW for Prn use...  ~  August 10, 2013:  50yr ROV & Alexis Donovan called requesting a new appt for persistent resp symptoms> she relates the onset of this prob back last Sept when she had a typical URI/ bronchitis that was refractory to management & required several appts w/ DrGates and left her w/ intermittent cough, beige sput, chest congestion, irritated throat & hoarseness; this has waxed & waned up to the present but she notes "I'm not sick" and "I breathe fine thru my nose" but notes the intermit congestion, cough, hoarseness, etc; she  notes a dry cough at night and notes occas cough after eating but specifically denies heartburn, acid reflux, etc... Exam showed scat bilat rhonchi & chest congestion;  We decided to check PFT & CXR;  We reviewed the following medical problems during today's office visit >>     RADS, Asthma> she has both Symbicort 80 & 160 but not using either one regularly "just as needed for flair-ups"; as above- she has a number of upper airway inflamm symptoms...    HBP> on BenicarHCT 40-25; BP= 124/82 7 similar at home she says; denies HA, visual sx, CP, palpit, SOB, edema...    Hypothyroid> on Synthroid112; followed by Alexis Donovan and she is clinically euthyroid on this dose...    Anxiety/ Depression> followed by DrGates on Lexapro10-1/2, Klonopin0.5prn, Ambien10-1/2... We reviewed prob list, meds, xrays and labs> see below for updates >>   CXR 7/15 showed norm heart size, sl tortuous Ao, clear lungs, NAD...   PFT 7/15 revealed FVC=2.67 (80%); FEV1=1.89 (75%), %1sec=71; mid-flows=56% predicted... 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 In this regard we will treat the airway inflamm process w/ Depo80, Pred20mg - 4d tapering sched, Symbicort160-2spBid, along w/ prn Mucinex, Fluids, Delsym;  For the reflux component we discussed a vigorous antireflux regimen- Protonix40 taken 5min before the eve meal, NPO after dinner, elev HOB 6" blocks... We will recheck her in 11mo...   ~  September 12, 2013:  50mo ROV & recheck> after last visit we decided to treat her RADS w/ Depo80,  Pred20mg - tapering sched, Symbicort160-2spBid, Mucinex as needed; we also addressed her reflux component w/ Protonix40, NPO after diner, elev HOB 6" blocks; she states not much better on the Pred (now out), with the symbicort regularly, and the antireflux regimen; oddly enough she reports week long trip to the beach w/ friends last week & she felt much better there, she cut back the Symbicort, forgot the Protonix & bed was not elev- but she was much better, an improvement she credits to the good weather, low humidity, etc; back home now w/ the wet/ rainy weather her congestion symptoms have returned; for what it is worth- Exam is neg w/ norm appearing throat & clear lungs...     I told her that in my opinion there wasn't much more that we could do for her, but i wold like to get another opinion from [facility] who has additional expertise w/ these refractory pts; otherw she would need to pursue subspecialty ENT & GI evaluations and 3rd opinion from a regional medical center... In the interim she is encouraged to take the Symbicort & Mucinex regularly, plus the vigorous antireflux regimen diligently...            Problem List:  Her Primary physician is DrGates...  ALLERGIC RHINITIS (ICD-477.9) INTRINSIC ASTHMA, UNSPECIFIED (ICD-493.10) REACTIVE AIRWAYS DYSFUNCTION SYNDROME >>  ~  baseline CXR= I don't have a baseline CXR on her- she indicated that routine CXR at [facility] office was "clear & WNL"... ~  prev PFT's 4/08 showed FVC= 2.75 (84%), FEV1= 1.93 (74%), %1sec= 70, mid-flows= 46%... all c/w  mild airflow obstruction/ small airways disease... ~  CXR here 10/11 showed borderline heart size, min apical pleural thickening, clear, NAD.Marland Kitchen.  ~  7/15: she present ed w/ chronic persistent airway inflamm symptoms (as above); treated w/ Depo/ Pred, Symbicort160, and vigorous antireflux regimen...  ~  8/15: she reports not much better & we discussed obtaining a 2nd opinion from Alexis Donovan...  HYPERTENSION  (ICD-401.9) - on BenicarHCT 40-25; BP= 124/82 7 similar at home she says; denies HA, visual sx, CP, palpit, SOB, edema...  HYPOTHYROIDISM (ICD-244.9) - on Synthroid112; followed by DrGates and she is clinically euthyroid on this dose...  ANXIETY DEPRESSION (ICD-300.4) - followed by DrGates on Lexapro10-1/2, Klonopin0.5prn, Ambien10-1/2...   Past Surgical History  Procedure Laterality Date  . Abdominal hysterectomy      Family History  Problem Relation Age of Onset  . Heart disease Mother   . Heart disease Father   . Heart disease Maternal Grandfather   . Heart disease Paternal Grandfather   . Cancer Daughter     breast    History   Social History  . Marital Status: Married    Spouse Name: N/A    Number of Children: N/A  . Years of Education: N/A   Occupational History  . Not on file.   Social History Main Topics  . Smoking status: Never Smoker   .  Smokeless tobacco: Not on file  . Alcohol Use: Yes  . Drug Use: No  . Sexual Activity: Yes   Other Topics Concern  . Not on file   Social History Narrative  . No narrative on file    Outpatient Encounter Prescriptions as of 09/12/2013  Medication Sig  . budesonide-formoterol (SYMBICORT) 160-4.5 MCG/ACT inhaler Inhale 2 puffs into the lungs 2 (two) times daily.  . clonazePAM (KLONOPIN) 0.5 MG tablet Take 0.5 mg by mouth as needed for anxiety.  Marland Kitchen escitalopram (LEXAPRO) 10 MG tablet Take 5 mg by mouth daily.   Marland Kitchen estrogens, conjugated, (PREMARIN) 0.3 MG tablet Take 0.3 mg by mouth daily. Take daily for 21 days then do not take for 7 days.  Marland Kitchen levothyroxine (SYNTHROID, LEVOTHROID) 112 MCG tablet Take 112 mcg by mouth daily.  . Multiple Vitamin (MULTIVITAMIN) tablet Take 1 tablet by mouth daily.  Marland Kitchen olmesartan-hydrochlorothiazide (BENICAR HCT) 40-25 MG per tablet Take 1 tablet by mouth daily.  . pantoprazole (PROTONIX) 40 MG tablet Take 1 tablet (40 mg total) by mouth daily. 30 minutes before the evening meal  . zolpidem  (AMBIEN) 10 MG tablet Take 5 mg by mouth at bedtime as needed.  . [DISCONTINUED] predniSONE (DELTASONE) 20 MG tablet Take as directed    No Known Allergies   Current Medications, Allergies, Past Medical History, Past Surgical History, Family History, and Social History were reviewed in [employer] record.   Review of Systems         See HPI      The patient complains of prolonged cough.  The patient denies anorexia, fever, weight loss, weight gain, vision loss, decreased hearing, hoarseness, chest pain, syncope, dyspnea on exertion, peripheral edema, headaches, hemoptysis, abdominal pain, melena, hematochezia, severe indigestion/heartburn, hematuria, incontinence, muscle weakness, suspicious skin lesions, transient blindness, difficulty walking, depression, unusual weight change, abnormal bleeding, enlarged lymph nodes, and angioedema.     Objective:   Physical Exam    WD, WN, 73 y/o WF in NAD... GENERAL:  Alert & oriented; pleasant & cooperative... HEENT:  [place]/AT, EOM-wnl, PERRLA, EACs-clear, TMs-wnl, NOSE- sl congested, THROAT-clear & wnl. NECK:  Supple w/ fairROM; no JVD; normal carotid impulses w/o bruits; no thyromegaly or nodules palpated; no lymphadenopathy. CHEST:  Clear to P & A; currently without wheezing, rales or rhonchi... HEART:  Regular Rhythm; prominent P2, gr 1/6 SEM without rubs or gallops detected... ABDOMEN:  Soft & nontender; normal bowel sounds; no organomegaly or masses palpated... EXT: without deformities or arthritic changes; no varicose veins/ venous insuffic/ or edema. NEURO:  CN's intact; no focal neuro deficits... DERM:  No lesions noted; no rash etc...  RADIOLOGY DATA:  Reviewed in the EPIC EMR & discussed w/ the patient...  LABORATORY DATA:  Reviewed in the EPIC EMR & discussed w/ the patient...   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  HBP> continue meds from DrGates  Hypothyroid> continue meds from [facility]...  Anxiety/ Depression> continue meds from DrGates...      Plan:     Patient's Medications  New Prescriptions   No medications on file  Previous Medications   BUDESONIDE-FORMOTEROL (SYMBICORT) 160-4.5 MCG/ACT INHALER  Inhale 2 puffs into the lungs 2 (two) times daily.   CLONAZEPAM (KLONOPIN) 0.5 MG TABLET    Take 0.5 mg by mouth as needed for anxiety.   ESCITALOPRAM (LEXAPRO) 10 MG TABLET    Take 5 mg by mouth daily.    ESTROGENS, CONJUGATED, (PREMARIN) 0.3 MG TABLET    Take 0.3 mg by mouth daily. Take daily for 21 days then do not take for 7 days.   LEVOTHYROXINE (SYNTHROID, LEVOTHROID) 112 MCG TABLET    Take 112 mcg by mouth daily.   MULTIPLE VITAMIN (MULTIVITAMIN) TABLET    Take 1 tablet by mouth daily.   OLMESARTAN-HYDROCHLOROTHIAZIDE (BENICAR HCT) 40-25 MG PER TABLET    Take 1 tablet by mouth daily.   PANTOPRAZOLE (PROTONIX) 40 MG TABLET    Take 1 tablet (40 mg total) by mouth daily. 30 minutes before the evening meal   ZOLPIDEM (AMBIEN) 10 MG TABLET    Take 5 mg by mouth at bedtime as needed.  Modified Medications   No medications on file  Discontinued Medications   PREDNISONE (DELTASONE) 20 MG TABLET    Take as directed

## 2013-09-12 NOTE — Patient Instructions (Signed)
Today we updated your med list in our EPIC system...    Continue your current medications the same...  Continue the Symbicort160- 2 sprays twice daily & rinse...  Continue the Protonix40 & the anti-reflux regimen as we discussed...  We will arrange for an evaluation by Dr. Allena Katz here in [facility] as the next step in the work up of your refractory symptoms.Marland KitchenMarland Kitchen

## 2013-10-22 ENCOUNTER — Other Ambulatory Visit (HOSPITAL_COMMUNITY): Payer: Self-pay | Admitting: Internal Medicine

## 2013-10-22 DIAGNOSIS — Z1231 Encounter for screening mammogram for malignant neoplasm of breast: Secondary | ICD-10-CM

## 2013-10-29 ENCOUNTER — Ambulatory Visit (HOSPITAL_COMMUNITY)
Admission: RE | Admit: 2013-10-29 | Discharge: 2013-10-29 | Disposition: A | Discharge status: Home / Self Care | Payer: Medicare Other | Source: Ambulatory Visit | Attending: Internal Medicine | Admitting: Internal Medicine

## 2013-10-29 DIAGNOSIS — Z1231 Encounter for screening mammogram for malignant neoplasm of breast: Secondary | ICD-10-CM | POA: Insufficient documentation

## 2014-03-20 ENCOUNTER — Encounter (HOSPITAL_COMMUNITY)
Admission: EM | Disposition: A | Discharge status: Home / Self Care | Payer: Self-pay | Source: Home / Self Care | Attending: Emergency Medicine

## 2014-03-20 ENCOUNTER — Emergency Department (HOSPITAL_COMMUNITY): Payer: Medicare Other | Admitting: Anesthesiology

## 2014-03-20 ENCOUNTER — Observation Stay (HOSPITAL_COMMUNITY)
Admission: EM | Admit: 2014-03-20 | Discharge: 2014-03-21 | Disposition: A | Discharge status: Home / Self Care | Payer: Medicare Other | Attending: General Surgery | Admitting: General Surgery

## 2014-03-20 ENCOUNTER — Emergency Department (HOSPITAL_COMMUNITY): Payer: Medicare Other

## 2014-03-20 ENCOUNTER — Encounter (HOSPITAL_COMMUNITY): Payer: Self-pay | Admitting: *Deleted

## 2014-03-20 DIAGNOSIS — K811 Chronic cholecystitis: Secondary | ICD-10-CM | POA: Diagnosis not present

## 2014-03-20 DIAGNOSIS — Z01818 Encounter for other preprocedural examination: Secondary | ICD-10-CM

## 2014-03-20 DIAGNOSIS — Z79899 Other long term (current) drug therapy: Secondary | ICD-10-CM | POA: Diagnosis not present

## 2014-03-20 DIAGNOSIS — I10 Essential (primary) hypertension: Secondary | ICD-10-CM | POA: Insufficient documentation

## 2014-03-20 DIAGNOSIS — K8050 Calculus of bile duct without cholangitis or cholecystitis without obstruction: Secondary | ICD-10-CM | POA: Diagnosis present

## 2014-03-20 DIAGNOSIS — K859 Acute pancreatitis, unspecified: Secondary | ICD-10-CM | POA: Diagnosis not present

## 2014-03-20 DIAGNOSIS — E079 Disorder of thyroid, unspecified: Secondary | ICD-10-CM | POA: Insufficient documentation

## 2014-03-20 DIAGNOSIS — K810 Acute cholecystitis: Secondary | ICD-10-CM

## 2014-03-20 HISTORY — PX: CHOLECYSTECTOMY: SHX55

## 2014-03-20 HISTORY — DX: Hypothyroidism, unspecified: E03.9

## 2014-03-20 LAB — CBC WITH DIFFERENTIAL/PLATELET
BASOS ABS: 0 10*3/uL (ref 0.0–0.1)
BASOS PCT: 0 % (ref 0–1)
EOS ABS: 0.2 10*3/uL (ref 0.0–0.7)
EOS PCT: 1 % (ref 0–5)
HCT: 36.7 % (ref 36.0–46.0)
Hemoglobin: 12.1 g/dL (ref 12.0–15.0)
Lymphocytes Relative: 17 % (ref 12–46)
Lymphs Abs: 2 10*3/uL (ref 0.7–4.0)
MCH: 30 pg (ref 26.0–34.0)
MCHC: 33 g/dL (ref 30.0–36.0)
MCV: 91.1 fL (ref 78.0–100.0)
Monocytes Absolute: 0.4 10*3/uL (ref 0.1–1.0)
Monocytes Relative: 4 % (ref 3–12)
Neutro Abs: 9.3 10*3/uL — ABNORMAL HIGH (ref 1.7–7.7)
Neutrophils Relative %: 78 % — ABNORMAL HIGH (ref 43–77)
Platelets: 246 10*3/uL (ref 150–400)
RBC: 4.03 MIL/uL (ref 3.87–5.11)
RDW: 13.1 % (ref 11.5–15.5)
WBC: 11.9 10*3/uL — AB (ref 4.0–10.5)

## 2014-03-20 LAB — COMPREHENSIVE METABOLIC PANEL
ALBUMIN: 3.7 g/dL (ref 3.5–5.2)
ALK PHOS: 54 U/L (ref 39–117)
ALT: 14 U/L (ref 0–35)
AST: 22 U/L (ref 0–37)
Anion gap: 10 (ref 5–15)
BILIRUBIN TOTAL: 0.3 mg/dL (ref 0.3–1.2)
BUN: 19 mg/dL (ref 6–23)
CHLORIDE: 103 mmol/L (ref 96–112)
CO2: 22 mmol/L (ref 19–32)
Calcium: 9.2 mg/dL (ref 8.4–10.5)
Creatinine, Ser: 0.79 mg/dL (ref 0.50–1.10)
GFR calc Af Amer: >90 mL/min (ref >90)
GFR calc non Af Amer: 81 mL/min — ABNORMAL LOW (ref >90)
Glucose, Bld: 118 mg/dL — ABNORMAL HIGH (ref 70–99)
Potassium: 3.3 mmol/L — ABNORMAL LOW (ref 3.5–5.1)
SODIUM: 135 mmol/L (ref 135–145)
Total Protein: 6.7 g/dL (ref 6.0–8.3)

## 2014-03-20 LAB — I-STAT TROPONIN, ED: Troponin i, poc: 0 ng/mL (ref 0.00–0.08)

## 2014-03-20 LAB — LIPASE, BLOOD: LIPASE: 41 U/L (ref 11–59)

## 2014-03-20 SURGERY — LAPAROSCOPIC CHOLECYSTECTOMY
Anesthesia: General | Site: Abdomen

## 2014-03-20 MED ORDER — NEOSTIGMINE METHYLSULFATE 10 MG/10ML IV SOLN
INTRAVENOUS | Status: AC
  Filled 2014-03-20: qty 1

## 2014-03-20 MED ORDER — HYDROMORPHONE HCL 1 MG/ML IJ SOLN
1.0000 mg | Freq: Once | INTRAMUSCULAR | Status: AC
  Administered 2014-03-20: 1 mg via INTRAVENOUS
  Filled 2014-03-20: qty 1

## 2014-03-20 MED ORDER — SODIUM CHLORIDE 0.9 % IV BOLUS (SEPSIS)
1000.0000 mL | Freq: Once | INTRAVENOUS | Status: AC
  Administered 2014-03-20: 1000 mL via INTRAVENOUS

## 2014-03-20 MED ORDER — HYDROCODONE-ACETAMINOPHEN 5-325 MG PO TABS
1.0000 | ORAL_TABLET | ORAL | Status: DC | PRN
  Administered 2014-03-20: 2 via ORAL
  Filled 2014-03-20: qty 2

## 2014-03-20 MED ORDER — ONDANSETRON HCL 4 MG/2ML IJ SOLN
INTRAMUSCULAR | Status: AC
  Filled 2014-03-20: qty 2

## 2014-03-20 MED ORDER — ZOLPIDEM TARTRATE 5 MG PO TABS: 2.5000 mg | ORAL_TABLET | ORAL | Status: DC

## 2014-03-20 MED ORDER — LACTATED RINGERS IV SOLN
INTRAVENOUS | Status: DC | PRN
  Administered 2014-03-20: 09:00:00 via INTRAVENOUS

## 2014-03-20 MED ORDER — DEXAMETHASONE SODIUM PHOSPHATE 4 MG/ML IJ SOLN
INTRAMUSCULAR | Status: DC | PRN
  Administered 2014-03-20: 8 mg via INTRAVENOUS

## 2014-03-20 MED ORDER — 0.9 % SODIUM CHLORIDE (POUR BTL) OPTIME
TOPICAL | Status: DC | PRN
  Administered 2014-03-20: 1000 mL

## 2014-03-20 MED ORDER — LIDOCAINE HCL (CARDIAC) 20 MG/ML IV SOLN
INTRAVENOUS | Status: AC
  Filled 2014-03-20: qty 5

## 2014-03-20 MED ORDER — FENTANYL CITRATE 0.05 MG/ML IJ SOLN
50.0000 ug | Freq: Once | INTRAMUSCULAR | Status: AC
Start: 2014-03-20 — End: 2014-03-20
  Administered 2014-03-20: 50 ug via INTRAVENOUS
  Filled 2014-03-20: qty 2

## 2014-03-20 MED ORDER — NEOSTIGMINE METHYLSULFATE 10 MG/10ML IV SOLN
INTRAVENOUS | Status: DC | PRN
  Administered 2014-03-20: 3 mg via INTRAVENOUS

## 2014-03-20 MED ORDER — BUPIVACAINE-EPINEPHRINE 0.25% -1:200000 IJ SOLN
INTRAMUSCULAR | Status: DC | PRN
  Administered 2014-03-20: 20 mL

## 2014-03-20 MED ORDER — BUPIVACAINE-EPINEPHRINE (PF) 0.25% -1:200000 IJ SOLN
INTRAMUSCULAR | Status: AC
  Filled 2014-03-20: qty 30

## 2014-03-20 MED ORDER — ACETAMINOPHEN 650 MG RE SUPP: 650.0000 mg | Freq: Four times a day (QID) | RECTAL | Status: DC | PRN

## 2014-03-20 MED ORDER — FENTANYL CITRATE 0.05 MG/ML IJ SOLN: 25.0000 ug | INTRAMUSCULAR | Status: DC | PRN

## 2014-03-20 MED ORDER — SODIUM CHLORIDE 0.9 % IV SOLN: Freq: Once | INTRAVENOUS | Status: DC

## 2014-03-20 MED ORDER — ONDANSETRON HCL 4 MG/2ML IJ SOLN
INTRAMUSCULAR | Status: DC | PRN
  Administered 2014-03-20: 4 mg via INTRAVENOUS

## 2014-03-20 MED ORDER — ESTROGENS CONJUGATED 0.3 MG PO TABS: 0.3000 mg | ORAL_TABLET | Freq: Every day | ORAL | Status: DC

## 2014-03-20 MED ORDER — LIDOCAINE HCL (CARDIAC) 20 MG/ML IV SOLN
INTRAVENOUS | Status: DC | PRN
  Administered 2014-03-20: 70 mg via INTRAVENOUS

## 2014-03-20 MED ORDER — LACTATED RINGERS IV SOLN
INTRAVENOUS | Status: DC
  Administered 2014-03-20: 22:00:00 via INTRAVENOUS
  Administered 2014-03-20: 50 mL/h via INTRAVENOUS

## 2014-03-20 MED ORDER — ROCURONIUM BROMIDE 100 MG/10ML IV SOLN
INTRAVENOUS | Status: DC | PRN
  Administered 2014-03-20: 40 mg via INTRAVENOUS

## 2014-03-20 MED ORDER — DEXTROSE 5 % IV SOLN
2.0000 g | INTRAVENOUS | Status: AC
  Administered 2014-03-20 (×2): 2 g via INTRAVENOUS
  Filled 2014-03-20: qty 2

## 2014-03-20 MED ORDER — ONDANSETRON HCL 4 MG/2ML IJ SOLN
4.0000 mg | INTRAMUSCULAR | Status: DC | PRN
  Administered 2014-03-20: 4 mg via INTRAVENOUS
  Filled 2014-03-20: qty 2

## 2014-03-20 MED ORDER — ACETAMINOPHEN 325 MG PO TABS: 325.0000 mg | ORAL_TABLET | ORAL | Status: DC | PRN

## 2014-03-20 MED ORDER — LEVOTHYROXINE SODIUM 112 MCG PO TABS
112.0000 ug | ORAL_TABLET | Freq: Every day | ORAL | Status: DC
  Filled 2014-03-20: qty 1

## 2014-03-20 MED ORDER — FENTANYL CITRATE 0.05 MG/ML IJ SOLN
INTRAMUSCULAR | Status: DC | PRN
  Administered 2014-03-20: 50 ug via INTRAVENOUS
  Administered 2014-03-20: 100 ug via INTRAVENOUS
  Administered 2014-03-20: 50 ug via INTRAVENOUS

## 2014-03-20 MED ORDER — ACETAMINOPHEN 325 MG PO TABS
650.0000 mg | ORAL_TABLET | Freq: Four times a day (QID) | ORAL | Status: DC | PRN
Start: 2014-03-20 — End: 2014-03-21

## 2014-03-20 MED ORDER — GLYCOPYRROLATE 0.2 MG/ML IJ SOLN
INTRAMUSCULAR | Status: DC | PRN
  Administered 2014-03-20: 0.4 mg via INTRAVENOUS

## 2014-03-20 MED ORDER — GLYCOPYRROLATE 0.2 MG/ML IJ SOLN
INTRAMUSCULAR | Status: AC
  Filled 2014-03-20: qty 3

## 2014-03-20 MED ORDER — ACETAMINOPHEN 160 MG/5ML PO SOLN
325.0000 mg | ORAL | Status: DC | PRN
  Filled 2014-03-20: qty 20.3

## 2014-03-20 MED ORDER — PROPOFOL 10 MG/ML IV BOLUS
INTRAVENOUS | Status: DC | PRN
  Administered 2014-03-20: 150 mg via INTRAVENOUS

## 2014-03-20 MED ORDER — ESCITALOPRAM OXALATE 10 MG PO TABS
5.0000 mg | ORAL_TABLET | Freq: Every day | ORAL | Status: DC
  Filled 2014-03-20: qty 1

## 2014-03-20 MED ORDER — PROPOFOL 10 MG/ML IV BOLUS
INTRAVENOUS | Status: AC
  Filled 2014-03-20: qty 20

## 2014-03-20 MED ORDER — MORPHINE SULFATE 2 MG/ML IJ SOLN: 1.0000 mg | INTRAMUSCULAR | Status: DC | PRN

## 2014-03-20 MED ORDER — ENOXAPARIN SODIUM 40 MG/0.4ML ~~LOC~~ SOLN
40.0000 mg | SUBCUTANEOUS | Status: DC
  Administered 2014-03-20: 40 mg via SUBCUTANEOUS
  Filled 2014-03-20: qty 0.4

## 2014-03-20 MED ORDER — SODIUM CHLORIDE 0.9 % IR SOLN
Status: DC | PRN
  Administered 2014-03-20: 1000 mL

## 2014-03-20 MED ORDER — OXYCODONE HCL 5 MG/5ML PO SOLN: 5.0000 mg | Freq: Once | ORAL | Status: DC | PRN

## 2014-03-20 MED ORDER — PROMETHAZINE HCL 25 MG/ML IJ SOLN
6.2500 mg | Freq: Four times a day (QID) | INTRAMUSCULAR | Status: DC | PRN
  Administered 2014-03-20: 6.25 mg via INTRAVENOUS
  Filled 2014-03-20: qty 1

## 2014-03-20 MED ORDER — FLUTICASONE PROPIONATE HFA 44 MCG/ACT IN AERO
1.0000 | INHALATION_SPRAY | Freq: Two times a day (BID) | RESPIRATORY_TRACT | Status: DC
  Filled 2014-03-20: qty 10.6

## 2014-03-20 MED ORDER — OXYCODONE HCL 5 MG PO TABS: 5.0000 mg | ORAL_TABLET | Freq: Once | ORAL | Status: DC | PRN

## 2014-03-20 MED ORDER — TRIAMTERENE-HCTZ 37.5-25 MG PO TABS
1.0000 | ORAL_TABLET | Freq: Every day | ORAL | Status: DC
  Filled 2014-03-20: qty 1

## 2014-03-20 MED ORDER — ROCURONIUM BROMIDE 50 MG/5ML IV SOLN
INTRAVENOUS | Status: AC
  Filled 2014-03-20: qty 1

## 2014-03-20 MED ORDER — CLONAZEPAM 0.5 MG PO TABS
0.5000 mg | ORAL_TABLET | Freq: Two times a day (BID) | ORAL | Status: DC | PRN
  Administered 2014-03-20: 0.5 mg via ORAL
  Filled 2014-03-20: qty 1

## 2014-03-20 MED ORDER — FENTANYL CITRATE 0.05 MG/ML IJ SOLN
INTRAMUSCULAR | Status: AC
  Filled 2014-03-20: qty 5

## 2014-03-20 MED ORDER — ONDANSETRON HCL 4 MG/2ML IJ SOLN
4.0000 mg | Freq: Once | INTRAMUSCULAR | Status: AC
  Administered 2014-03-20: 4 mg via INTRAVENOUS
  Filled 2014-03-20: qty 2

## 2014-03-20 MED ORDER — HYDROMORPHONE HCL 1 MG/ML IJ SOLN
0.5000 mg | INTRAMUSCULAR | Status: DC | PRN
Start: 2014-03-20 — End: 2014-03-21

## 2014-03-20 SURGICAL SUPPLY — 43 items
APPLIER CLIP 5 13 M/L LIGAMAX5 (MISCELLANEOUS) ×3
APR CLP MED LRG 5 ANG JAW (MISCELLANEOUS) ×2
BAG SPEC RTRVL LRG 6X4 10 (ENDOMECHANICALS)
BLADE SURG ROTATE 9660 (MISCELLANEOUS) IMPLANT
CANISTER SUCTION 2500CC (MISCELLANEOUS) ×3 IMPLANT
CHLORAPREP W/TINT 26ML (MISCELLANEOUS) ×3 IMPLANT
CLIP APPLIE 5 13 M/L LIGAMAX5 (MISCELLANEOUS) ×2 IMPLANT
COVER MAYO STAND STRL (DRAPES) ×1 IMPLANT
COVER SURGICAL LIGHT HANDLE (MISCELLANEOUS) ×3 IMPLANT
DRAPE C-ARM 42X72 X-RAY (DRAPES) ×1 IMPLANT
DRAPE LAPAROSCOPIC ABDOMINAL (DRAPES) ×3 IMPLANT
DRSG TEGADERM 2-3/8X2-3/4 SM (GAUZE/BANDAGES/DRESSINGS) ×12 IMPLANT
ELECT REM PT RETURN 9FT ADLT (ELECTROSURGICAL) ×3
ELECTRODE REM PT RTRN 9FT ADLT (ELECTROSURGICAL) ×2 IMPLANT
GLOVE BIO SURGEON STRL SZ7 (GLOVE) ×2 IMPLANT
GLOVE BIO SURGEON STRL SZ7.5 (GLOVE) ×4 IMPLANT
GLOVE BIOGEL PI IND STRL 7.0 (GLOVE) ×1 IMPLANT
GLOVE BIOGEL PI IND STRL 7.5 (GLOVE) ×1 IMPLANT
GLOVE BIOGEL PI IND STRL 8 (GLOVE) ×2 IMPLANT
GLOVE BIOGEL PI INDICATOR 7.0 (GLOVE) ×1
GLOVE BIOGEL PI INDICATOR 7.5 (GLOVE) ×1
GLOVE BIOGEL PI INDICATOR 8 (GLOVE) ×1
GLOVE ECLIPSE 7.5 STRL STRAW (GLOVE) ×3 IMPLANT
GOWN STRL REUS W/ TWL LRG LVL3 (GOWN DISPOSABLE) ×6 IMPLANT
GOWN STRL REUS W/TWL LRG LVL3 (GOWN DISPOSABLE) ×9
KIT BASIN OR (CUSTOM PROCEDURE TRAY) ×3 IMPLANT
KIT ROOM TURNOVER OR (KITS) ×3 IMPLANT
LIQUID BAND (GAUZE/BANDAGES/DRESSINGS) ×3 IMPLANT
NS IRRIG 1000ML POUR BTL (IV SOLUTION) ×3 IMPLANT
PAD ARMBOARD 7.5X6 YLW CONV (MISCELLANEOUS) ×3 IMPLANT
POUCH SPECIMEN RETRIEVAL 10MM (ENDOMECHANICALS) IMPLANT
SCISSORS LAP 5X35 DISP (ENDOMECHANICALS) ×3 IMPLANT
SET CHOLANGIOGRAPH 5 50 .035 (SET/KITS/TRAYS/PACK) ×1 IMPLANT
SET IRRIG TUBING LAPAROSCOPIC (IRRIGATION / IRRIGATOR) ×3 IMPLANT
SLEEVE ENDOPATH XCEL 5M (ENDOMECHANICALS) ×6 IMPLANT
SPECIMEN JAR SMALL (MISCELLANEOUS) ×3 IMPLANT
SUT MNCRL AB 4-0 PS2 18 (SUTURE) ×3 IMPLANT
TOWEL OR 17X24 6PK STRL BLUE (TOWEL DISPOSABLE) ×3 IMPLANT
TOWEL OR 17X26 10 PK STRL BLUE (TOWEL DISPOSABLE) ×1 IMPLANT
TRAY LAPAROSCOPIC (CUSTOM PROCEDURE TRAY) ×3 IMPLANT
TROCAR XCEL BLUNT TIP 100MML (ENDOMECHANICALS) ×3 IMPLANT
TROCAR XCEL NON-BLD 5MMX100MML (ENDOMECHANICALS) ×3 IMPLANT
TUBING INSUFFLATION (TUBING) ×3 IMPLANT

## 2014-03-20 NOTE — ED Notes (Signed)
Pt reports mid epigastric pain with nausea since 0130.

## 2014-03-20 NOTE — ED Notes (Signed)
Report given to short stay-- pt to be transferred to bay 36

## 2014-03-20 NOTE — Progress Notes (Signed)
Patient transferred to floor from PACU. Family at bedside. Patient stable.

## 2014-03-20 NOTE — Transfer of Care (Signed)
Immediate Anesthesia Transfer of Care Note  Patient: Alexis Donovan  Procedure(s) Performed: Procedure(s): LAPAROSCOPIC CHOLECYSTECTOMY (N/A)  Patient Location: PACU  Anesthesia Type:General  Level of Consciousness: awake, alert , oriented and patient cooperative  Airway & Oxygen Therapy: Patient Spontanous Breathing  Post-op Assessment: Report given to RN and Post -op Vital signs reviewed and stable  Post vital signs: Reviewed and stable  Last Vitals:  Filed Vitals:   03/20/14 1110  BP: 121/58  Pulse: 81  Temp:   Resp: 17    Complications: No apparent anesthesia complications

## 2014-03-20 NOTE — Progress Notes (Signed)
Patient admitted to [facility] room 05 from PACU. S/P lap chole. Awake, alert and oriented. Incisions with dry dressings. Oriented to room. Call bell in reach. Denies pain. Due to void.

## 2014-03-20 NOTE — ED Notes (Signed)
Patient transported to Ultrasound 

## 2014-03-20 NOTE — Anesthesia Preprocedure Evaluation (Addendum)
Anesthesia Evaluation  Patient identified by MRN, date of birth, ID band Patient awake    Reviewed: Allergy & Precautions, NPO status , Patient's Chart, lab work & pertinent test results, reviewed documented beta blocker date and time   History of Anesthesia Complications Negative for: history of anesthetic complications  Airway Mallampati: II  TM Distance: >3 FB Neck ROM: Full    Dental  (+) Dental Advisory Given, Teeth Intact   Pulmonary asthma ,  breath sounds clear to auscultation        Cardiovascular hypertension, Pt. on medications Rhythm:Regular     Neuro/Psych PSYCHIATRIC DISORDERS Depression negative neurological ROS     GI/Hepatic GERD-  Medicated,  Endo/Other  Hypothyroidism   Renal/GU      Musculoskeletal   Abdominal   Peds  Hematology   Anesthesia Other Findings Pt states several crowns in back  Reproductive/Obstetrics                           Anesthesia Physical Anesthesia Plan  ASA: II and emergent  Anesthesia Plan: General   Post-op Pain Management:    Induction: Intravenous  Airway Management Planned: Oral ETT  Additional Equipment: None  Intra-op Plan:   Post-operative Plan: Extubation in OR  Informed Consent: I have reviewed the patients History and Physical, chart, labs and discussed the procedure including the risks, benefits and alternatives for the proposed anesthesia with the patient or authorized representative who has indicated his/her understanding and acceptance.   Dental advisory given  Plan Discussed with: CRNA and Surgeon  Anesthesia Plan Comments:         Anesthesia Quick Evaluation

## 2014-03-20 NOTE — ED Provider Notes (Signed)
CSN: [id number]     Arrival date & time 03/20/14  0447 History   First MD Initiated Contact with Patient 03/20/14 0459     Chief Complaint  Patient presents with  . Abdominal Pain     (Consider location/radiation/quality/duration/timing/severity/associated sxs/prior Treatment) HPI Comments: Pt comes in with cc of abd pain. Pt started having abd pain at 1:30 am, anf it woke her up from her sleep. Pain is in the epigastrium, and non radiating, with no hx of n/v/f/c/diarrhea. Pt has no chest pain, dib. Hx of HTN medically, and surgical hx of hysterectomy. Pt has had similar pain in the recent past, but the current one is the most intense.  The history is provided by the patient.    Past Medical History  Diagnosis Date  . Thyroid disease   . Hypertension    Past Surgical History  Procedure Laterality Date  . Abdominal hysterectomy     Family History  Problem Relation Age of Onset  . Heart disease Mother   . Heart disease Father   . Heart disease Maternal Grandfather   . Heart disease Paternal Grandfather   . Cancer Daughter     breast   History  Substance Use Topics  . Smoking status: Never Smoker   . Smokeless tobacco: Not on file  . Alcohol Use: Yes   OB History    No data available     Review of Systems  Constitutional: Positive for activity change and appetite change.  HENT: Negative for facial swelling.   Respiratory: Negative for cough, shortness of breath and wheezing.   Cardiovascular: Negative for chest pain.  Gastrointestinal: Positive for nausea and abdominal pain. Negative for vomiting, diarrhea, constipation, blood in stool and abdominal distention.  Genitourinary: Negative for hematuria and difficulty urinating.  Musculoskeletal: Negative for neck pain.  Skin: Negative for color change.  Neurological: Negative for speech difficulty.  Hematological: Does not bruise/bleed easily.  Psychiatric/Behavioral: Negative for confusion.      Allergies   Review of patient's allergies indicates no known allergies.  Home Medications   Prior to Admission medications   Medication Sig Start Date End Date Taking? Authorizing Provider  beclomethasone (QVAR) 80 MCG/ACT inhaler Inhale 2 puffs into the lungs 2 (two) times daily.   Yes Historical Provider, MD  clonazePAM (KLONOPIN) 0.5 MG tablet Take 0.5 mg by mouth daily as needed for anxiety.    Yes Historical Provider, MD  DiphenhydrAMINE HCl, Sleep, (ZZZQUIL PO) Take 1 tablet by mouth every other day.   Yes Historical Provider, MD  escitalopram (LEXAPRO) 10 MG tablet Take 5 mg by mouth daily.    Yes Historical Provider, MD  estrogens, conjugated, (PREMARIN) 0.3 MG tablet Take 0.3 mg by mouth daily.    Yes Historical Provider, MD  levothyroxine (SYNTHROID, LEVOTHROID) 112 MCG tablet Take 112 mcg by mouth daily.   Yes Historical Provider, MD  Multiple Vitamin (MULTIVITAMIN) tablet Take 1 tablet by mouth daily.   Yes Historical Provider, MD  pantoprazole (PROTONIX) 40 MG tablet Take 1 tablet (40 mg total) by mouth daily. 30 minutes before the evening meal 08/10/13  Yes Noralee Space, MD  triamterene-hydrochlorothiazide (MAXZIDE-25) 37.5-25 MG per tablet Take 1 tablet by mouth daily.   Yes Historical Provider, MD  zolpidem (AMBIEN) 10 MG tablet Take 2.5 mg by mouth every other day.    Yes Historical Provider, MD  budesonide-formoterol (SYMBICORT) 160-4.5 MCG/ACT inhaler Inhale 2 puffs into the lungs 2 (two) times daily. Patient not taking: Reported on  03/20/2014 08/10/13   Noralee Space, MD  olmesartan-hydrochlorothiazide (BENICAR HCT) 40-25 MG per tablet Take 1 tablet by mouth daily.    Historical Provider, MD   BP 129/63 mmHg  Pulse 75  Temp(Src) 98.2 F (36.8 C) (Oral)  Resp 22  Ht 5\' 7"  (1.702 m)  Wt 158 lb (71.668 kg)  BMI 24.74 kg/m2  SpO2 86% Physical Exam  Constitutional: She is oriented to person, place, and time. She appears well-developed and well-nourished.  HENT:  Head: Normocephalic  and atraumatic.  Eyes: EOM are normal. Pupils are equal, round, and reactive to light.  Neck: Neck supple.  Cardiovascular: Normal rate, regular rhythm, normal heart sounds and intact distal pulses.   No murmur heard. Pulmonary/Chest: Effort normal. No respiratory distress.  Abdominal: Soft. She exhibits no distension. There is tenderness. There is no rebound and no guarding.  Epigastric and RUQ  - + murphy's sign  Neurological: She is alert and oriented to person, place, and time.  Skin: Skin is warm and dry.  Nursing note and vitals reviewed.   ED Course  Procedures (including critical care time) Labs Review Labs Reviewed  CBC WITH DIFFERENTIAL/PLATELET - Abnormal; Notable for the following:    WBC 11.9 (*)    Neutrophils Relative % 78 (*)    Neutro Abs 9.3 (*)    All other components within normal limits  COMPREHENSIVE METABOLIC PANEL - Abnormal; Notable for the following:    Potassium 3.3 (*)    Glucose, Bld 118 (*)    GFR calc non Af Amer 81 (*)    All other components within normal limits  LIPASE, BLOOD  I-STAT TROPOININ, ED    Imaging Review US Abdomen Limited  03/20/2014   CLINICAL DATA:  Right upper quadrant and epigastric abdominal pain. Pain for 5 hours.  EXAM: US ABDOMEN LIMITED - RIGHT UPPER QUADRANT  COMPARISON:  None.  FINDINGS: Gallbladder:  Mildly distended and contains multiple shadowing gallstones. There is a 7 mm nonmobile stone in the gallbladder neck. No gallbladder wall thickening. Sonographic Murphy sign reportedly positive.  Common bile duct:  Diameter: 8.6 mm.  No choledocholithiasis is seen.  Liver:  No focal lesion identified. Within normal limits in parenchymal echogenicity.  IMPRESSION: Cholelithiasis with non mobile stone in the gallbladder neck. In conjunction with positive sonographic Murphy sign, findings are concerning for acute cholecystitis, however there is no gallbladder wall thickening. Mild prominence of the common bile duct at 8.6 mm, no  choledocholithiasis is seen sonographically.   Electronically Signed   By: Jeb Levering M.D.   On: 03/20/2014 06:43     EKG Interpretation   Date/Time:  Wednesday March 20 2014 04:59:58 EST Ventricular Rate:  64 PR Interval:  222 QRS Duration: 131 QT Interval:  463 QTC Calculation: 478 R Axis:   76 Text Interpretation:  Sinus rhythm Prolonged PR interval RBBB No  significant change since Confirmed by Kendrew Paci, MD, Jonathin Heinicke [phone]) on  03/20/2014 5:31:57 AM      MDM   Final diagnoses:  Acute cholecystitis    DDx includes: Pancreatitis Hepatobiliary pathology including cholecystitis Gastritis/PUD SBO ACS syndrome Aortic Dissection  Pt comes in with abd pain, epigastric with nausea. Clinical concerns for cholecystitis, pancreatitis, Peptic ulcer disease. US shows gall stones - surgery called.  Varney Biles, MD 03/20/14 [phone]

## 2014-03-20 NOTE — Anesthesia Postprocedure Evaluation (Signed)
  Anesthesia Post-op Note  Patient: Alexis Donovan  Procedure(s) Performed: Procedure(s): LAPAROSCOPIC CHOLECYSTECTOMY (N/A)  Patient Location: PACU  Anesthesia Type:General  Level of Consciousness: awake  Airway and Oxygen Therapy: Patient Spontanous Breathing and Patient connected to nasal cannula oxygen  Post-op Pain: none  Post-op Assessment: Post-op Vital signs reviewed, Patient's Cardiovascular Status Stable, Respiratory Function Stable, Patent Airway, No signs of Nausea or vomiting and Pain level controlled  Post-op Vital Signs: Reviewed and stable  Last Vitals:  Filed Vitals:   03/20/14 1500  BP: 142/76  Pulse: 68  Temp: 36.6 C  Resp: 14    Complications: No apparent anesthesia complications

## 2014-03-20 NOTE — Op Note (Signed)
OPERATIVE REPORT  DATE OF OPERATION: 03/20/2014  PATIENT:  Alexis Donovan  74 y.o. female  PRE-OPERATIVE DIAGNOSIS:  CHOLELITHIASIS and acute cholecystitis  POST-OPERATIVE DIAGNOSIS:  Same  PROCEDURE:  Procedure(s): LAPAROSCOPIC CHOLECYSTECTOMY  SURGEON:  Surgeon(s): Doreen Salvage, MD  ASSISTANT: None  ANESTHESIA:   general  EBL: <20 ml  BLOOD ADMINISTERED: none  DRAINS: none   SPECIMEN:  Source of Specimen:  Gallbladder and stones  COUNTS CORRECT:  YES  PROCEDURE DETAILS: The patient was taken to the operating room and placed on the table in the supine position.  After an adequate endotracheal anesthetic was administered, the patient was prepped with ChloroPrep, and then draped in the usual manner exposing the entire abdomen laterally, inferiorly and up  to the costal margins.  After a proper timeout was performed including identifying the patient and the procedure to be performed, a supraumbilical 7.6BH midline incision was made using a #15 blade.  This was taken down to the fascia which was then incised with a #15 blade.  The edges of the fascia were tented up with Kocher clamps as the preperitoneal space was penetrated with a Kelly clamp into the peritoneum.  Once this was done, a pursestring suture of 0 Vicryl was passed around the fascial opening.  This was subsequently used to secure the [facility] cannula which was passed into the peritoneal cavity.  Once the [facility] cannula was in place, carbon dioxide gas was insufflated into the peritoneal cavity up to a maximal intra-abdominal pressure of 85mm Hg.The laparoscope, with attached camera and light source, was passed into the peritoneal cavity to visualize the direct insertion of two right upper quadrant 84mm cannulas, and a sup-xiphoid 43mm cannula.  Once all cannulas were in place, the dissection was begun.  Two ratcheted graspers were attached to the dome and infundibulum of the gallbladder and retracted towards the anterior  abdominal wall and the right upper quadrant.  Using cautery attached to a dissecting forceps, the peritoneum overlaying the triangle of Chalot and the hepatoduodenal triangle was dissected away exposing the cystic duct and the cystic artery.  The cystic artery was clipped proximally and distally then transected.  A clip was placed on the gallbladder side of the cystic duct, then the distal cystic duct was clipped multiple times then transected.  The gallbladder was then dissected out of the hepatic bed without event.  It was retrieved from the abdomen (using an EndoCatch bag) without event.  Once the gallbladder was removed, the bed was inspected for hemostasis.  Once excellent hemostasis was obtained all gas and fluids were aspirated from above the liver, then the cannulas were removed.  The supraumbilical incision was closed using the pursestring suture which was in place.  0.25% bupivicaine with epinephrine was injected at all sites.  All 50mm or greater cannula sites were close using a running subcuticular stitch of 4-0 Monocryl.  5.21mm cannula sites were closed with Dermabond only.Steri-Strips and Tagaderm were used to complete the dressings at all sites.  At this point all needle, sponge, and instrument counts were correct.The patient was awakened from anesthesia and taken to the PACU in stable condition.  PATIENT DISPOSITION:  PACU - hemodynamically stable.   Zailah Zagami, JAY 2/24/201611:15 AM

## 2014-03-20 NOTE — H&P (Signed)
Alexis Donovan [birth year]  [id number].   Primary Care MD: Dr. Mertha Finders Chief Complaint/Reason for Consult: RUQ abdominal pain HPI: This is a 74 yo white female with HTN and hypothyroidism who developed RUQ and epigastric abdominal pain last night at 0130am.  This woke her up.  She developed nausea, but no vomiting.  She had an episode like this about a month ago that went away on its own.  She denies fevers or chills.  She has had some diarrhea recently, but none over night.  Due to continued pain, the patient presented to [facility] where she was found to have a gallstone lodged in the neck of the gallbladder.  Her WBC was 12K.  Her LFTs are normal.  We have been asked to evaluate her for admission.  ROS : Please see HPI, otherwise negative  Family History  Problem Relation Age of Onset  . Heart disease Mother   . Heart disease Father   . Heart disease Maternal Grandfather   . Heart disease Paternal Grandfather   . Cancer Daughter     breast    Past Medical History  Diagnosis Date  . Thyroid disease   . Hypertension     Past Surgical History  Procedure Laterality Date  . Abdominal hysterectomy      Social History:  reports that she has never smoked. She does not have any smokeless tobacco history on file. She reports that she drinks about 4.2 oz of alcohol per week. She reports that she does not use illicit drugs.  Allergies: No Known Allergies   (Not in a hospital admission)  Blood pressure 129/63, pulse 70, temperature 98.2 F (36.8 C), temperature source Oral, resp. rate 13, height 5' 7"  (1.702 m), weight 158 lb (71.668 kg), SpO2 97 %. Physical Exam: General: pleasant, WD, WN white female who is laying in bed in NAD HEENT: head is normocephalic, atraumatic.  Sclera are noninjected.  PERRL.  Ears and nose without any masses or lesions.  Mouth is pink and moist Heart: regular, rate, and rhythm.  Normal s1,s2. No obvious murmurs, gallops, or rubs noted.  Palpable radial  and pedal pulses bilaterally Lungs: CTAB, no wheezes, rhonchi, or rales noted.  Respiratory effort nonlabored Abd: soft, tender in RUQ with +Murphy's sign, ND, +BS, no masses, hernias, or organomegaly.  Low midline scar from prior hysterectomy MS: all 4 extremities are symmetrical with no cyanosis, clubbing, or edema. Skin: warm and dry with no masses, lesions, or rashes Psych: A&Ox3 with an appropriate affect.    Results for orders placed or performed during the hospital encounter of 03/20/14 (from the past 48 hour(s))  CBC with Differential     Status: Abnormal   Collection Time: 03/20/14  5:35 AM  Result Value Ref Range   WBC 11.9 (H) 4.0 - 10.5 K/uL   RBC 4.03 3.87 - 5.11 MIL/uL   Hemoglobin 12.1 12.0 - 15.0 g/dL   HCT 36.7 36.0 - 46.0 %   MCV 91.1 78.0 - 100.0 fL   MCH 30.0 26.0 - 34.0 pg   MCHC 33.0 30.0 - 36.0 g/dL   RDW 13.1 11.5 - 15.5 %   Platelets 246 150 - 400 K/uL   Neutrophils Relative % 78 (H) 43 - 77 %   Neutro Abs 9.3 (H) 1.7 - 7.7 K/uL   Lymphocytes Relative 17 12 - 46 %   Lymphs Abs 2.0 0.7 - 4.0 K/uL   Monocytes Relative 4 3 - 12 %   Monocytes Absolute  0.4 0.1 - 1.0 K/uL   Eosinophils Relative 1 0 - 5 %   Eosinophils Absolute 0.2 0.0 - 0.7 K/uL   Basophils Relative 0 0 - 1 %   Basophils Absolute 0.0 0.0 - 0.1 K/uL  Comprehensive metabolic panel     Status: Abnormal   Collection Time: 03/20/14  5:35 AM  Result Value Ref Range   Sodium 135 135 - 145 mmol/L   Potassium 3.3 (L) 3.5 - 5.1 mmol/L   Chloride 103 96 - 112 mmol/L   CO2 22 19 - 32 mmol/L   Glucose, Bld 118 (H) 70 - 99 mg/dL   BUN 19 6 - 23 mg/dL   Creatinine, Ser 0.79 0.50 - 1.10 mg/dL   Calcium 9.2 8.4 - 10.5 mg/dL   Total Protein 6.7 6.0 - 8.3 g/dL   Albumin 3.7 3.5 - 5.2 g/dL   AST 22 0 - 37 U/L   ALT 14 0 - 35 U/L   Alkaline Phosphatase 54 39 - 117 U/L   Total Bilirubin 0.3 0.3 - 1.2 mg/dL   GFR calc non Af Amer 81 (L) >90 mL/min   GFR calc Af Amer >90 >90 mL/min    Comment:  (NOTE) The eGFR has been calculated using the CKD EPI equation. This calculation has not been validated in all clinical situations. eGFR's persistently <90 mL/min signify possible Chronic Kidney Disease.    Anion gap 10 5 - 15  Lipase, blood     Status: None   Collection Time: 03/20/14  5:35 AM  Result Value Ref Range   Lipase 41 11 - 59 U/L  I-stat troponin, ED (only if pt is 74 y.o. or older & pain is above umbilicus) - do not order at [facility]     Status: None   Collection Time: 03/20/14  5:41 AM  Result Value Ref Range   Troponin i, poc 0.00 0.00 - 0.08 ng/mL   Comment 3            Comment: Due to the release kinetics of cTnI, a negative result within the first hours of the onset of symptoms does not rule out myocardial infarction with certainty. If myocardial infarction is still suspected, repeat the test at appropriate intervals.    US Abdomen Limited  03/20/2014   CLINICAL DATA:  Right upper quadrant and epigastric abdominal pain. Pain for 5 hours.  EXAM: US ABDOMEN LIMITED - RIGHT UPPER QUADRANT  COMPARISON:  None.  FINDINGS: Gallbladder:  Mildly distended and contains multiple shadowing gallstones. There is a 7 mm nonmobile stone in the gallbladder neck. No gallbladder wall thickening. Sonographic Murphy sign reportedly positive.  Common bile duct:  Diameter: 8.6 mm.  No choledocholithiasis is seen.  Liver:  No focal lesion identified. Within normal limits in parenchymal echogenicity.  IMPRESSION: Cholelithiasis with non mobile stone in the gallbladder neck. In conjunction with positive sonographic Murphy sign, findings are concerning for acute cholecystitis, however there is no gallbladder wall thickening. Mild prominence of the common bile duct at 8.6 mm, no choledocholithiasis is seen sonographically.   Electronically Signed   By: Jeb Levering M.D.   On: 03/20/2014 06:43   Dg Chest Port 1 View  03/20/2014   CLINICAL DATA:  74 year old female scheduled for cholecystectomy today.  Preoperative chest x-ray.  EXAM: PORTABLE CHEST - 1 VIEW  COMPARISON:  Prior chest x-ray 08/10/2013  FINDINGS: Stable cardiac and mediastinal contours. Mild atherosclerotic calcifications in the transverse aorta. The descending thoracic aorta is tortuous. The lungs  are clear. No consolidation, edema, pleural effusion or pneumothorax. No acute osseous abnormality.  IMPRESSION: No acute cardiopulmonary process.   Electronically Signed   By: Jacqulynn Cadet M.D.   On: 03/20/2014 07:31       Assessment/Plan 1. Biliary colic/early cholecystitis 2. HTN 3. Hypothyroidism  Plan: 1. We will get the patient admitted and take her to the operating room for a cholecystectomy.  This, along with the procedure, risks, complications, and expected outcome, have been d/w the patient.  She understands and is agreeable to proceed.  Will start Rocephin.  Kalena Mander E 03/20/2014, 7:54 AM Pager: [phone]

## 2014-03-21 ENCOUNTER — Encounter (HOSPITAL_COMMUNITY): Payer: Self-pay | Admitting: General Surgery

## 2014-03-21 MED ORDER — HYDROCODONE-ACETAMINOPHEN 5-325 MG PO TABS: 1.0000 | ORAL_TABLET | ORAL | Status: DC | PRN

## 2014-03-21 NOTE — Progress Notes (Signed)
Discussed discharge summary with patient. Reviewed all medications with patient. Patient received Rx. Patient ready for discharge. 

## 2014-03-21 NOTE — Discharge Instructions (Signed)
[facility] ______[facility] °LAPAROSCOPIC SURGERY: POST OP INSTRUCTIONS °Always review your discharge instruction sheet given to you by the facility where your surgery was performed. °IF YOU HAVE DISABILITY OR FAMILY LEAVE FORMS, YOU MUST BRING THEM TO THE OFFICE FOR PROCESSING.   °DO NOT GIVE THEM TO YOUR DOCTOR. ° °1. A prescription for pain medication may be given to you upon discharge.  Take your pain medication as prescribed, if needed.  If narcotic pain medicine is not needed, then you may take acetaminophen (Tylenol) or ibuprofen (Advil) as needed. °2. Take your usually prescribed medications unless otherwise directed. °3. If you need a refill on your pain medication, please contact your pharmacy.  They will contact our office to request authorization. Prescriptions will not be filled after 5pm or on week-ends. °4. You should follow a light diet the first few days after arrival home, such as soup and crackers, etc.  Be sure to include lots of fluids daily. °5. Most patients will experience some swelling and bruising in the area of the incisions.  Ice packs will help.  Swelling and bruising can take several days to resolve.  °6. It is common to experience some constipation if taking pain medication after surgery.  Increasing fluid intake and taking a stool softener (such as Colace) will usually help or prevent this problem from occurring.  A mild laxative (Milk of Magnesia or Miralax) should be taken according to package instructions if there are no bowel movements after 48 hours. °7. Unless discharge instructions indicate otherwise, you may remove your bandages 24-48 hours after surgery, and you may shower at that time.  You may have steri-strips (small skin tapes) in place directly over the incision.  These strips should be left on the skin for 7-10 days.  If your surgeon used skin glue on the incision, you may shower in 24 hours.  The glue will flake off over the next 2-3 weeks.  Any sutures or  staples will be removed at the office during your follow-up visit. °8. ACTIVITIES:  You may resume regular (light) daily activities beginning the next day--such as daily self-care, walking, climbing stairs--gradually increasing activities as tolerated.  You may have sexual intercourse when it is comfortable.  Refrain from any heavy lifting or straining until approved by your doctor. °a. You may drive when you are no longer taking prescription pain medication, you can comfortably wear a seatbelt, and you can safely maneuver your car and apply brakes. °b. RETURN TO WORK:  __________________________________________________________ °9. You should see your doctor in the office for a follow-up appointment approximately 2-3 weeks after your surgery.  Make sure that you call for this appointment within a day or two after you arrive home to insure a convenient appointment time. °10. OTHER INSTRUCTIONS: __________________________________________________________________________________________________________________________ __________________________________________________________________________________________________________________________ °WHEN TO CALL YOUR DOCTOR: °1. Fever over 101.0 °2. Inability to urinate °3. Continued bleeding from incision. °4. Increased pain, redness, or drainage from the incision. °5. Increasing abdominal pain ° °The clinic staff is available to answer your questions during regular business hours.  Please don’t hesitate to call and ask to speak to one of the nurses for clinical concerns.  If you have a medical emergency, go to the nearest emergency room or call 911.  A surgeon from [facility] is always on call at the hospital. °[address], [address], [place], [place]  [address] ? [address] [address], [place], [place]   [address] °[phone] ? [phone] ? FAX [phone] °Web site:   [email or web address] °

## 2014-03-21 NOTE — Discharge Summary (Signed)
Patient ID: Alexis Donovan MRN: [id number] DOB/AGE: 1940-08-10 74 y.o.  Admit date: 03/20/2014 Discharge date: 03/21/2014  Procedures: lap chole  Consults: None  Reason for Admission: This is a 75 yo white female with HTN and hypothyroidism who developed RUQ and epigastric abdominal pain last night at 0130am. This woke her up. She developed nausea, but no vomiting. She had an episode like this about a month ago that went away on its own. She denies fevers or chills. She has had some diarrhea recently, but none over night. Due to continued pain, the patient presented to [facility] where she was found to have a gallstone lodged in the neck of the gallbladder. Her WBC was 12K. Her LFTs are normal. We have been asked to evaluate her for admission.  Admission Diagnoses:  1. Early cholecystitis  Hospital Course: The patient was admitted and taken to the OR where she underwent the above procedure.  She tolerated this well.  She was feeling well with minimal pain and tolerating a regular diet on POD 1.  She was felt stable for dc home.  PE: Abd: soft, appropriately tender, +BS, ND, incisions c/d/i  Discharge Diagnoses:  Principal Problem:   Biliary colic early cholecystitis, s/p lap chole  Discharge Medications:   Medication List    TAKE these medications        beclomethasone 80 MCG/ACT inhaler  Commonly known as:  QVAR  Inhale 2 puffs into the lungs 2 (two) times daily.     budesonide-formoterol 160-4.5 MCG/ACT inhaler  Commonly known as:  SYMBICORT  Inhale 2 puffs into the lungs 2 (two) times daily.     clonazePAM 0.5 MG tablet  Commonly known as:  KLONOPIN  Take 0.5 mg by mouth daily as needed for anxiety.     escitalopram 10 MG tablet  Commonly known as:  LEXAPRO  Take 5 mg by mouth daily.     estrogens (conjugated) 0.3 MG tablet  Commonly known as:  PREMARIN  Take 0.3 mg by mouth daily.     HYDROcodone-acetaminophen 5-325 MG per tablet  Commonly known as:   NORCO/VICODIN  Take 1-2 tablets by mouth every 4 (four) hours as needed for moderate pain or severe pain.     levothyroxine 112 MCG tablet  Commonly known as:  SYNTHROID, LEVOTHROID  Take 112 mcg by mouth daily.     multivitamin tablet  Take 1 tablet by mouth daily.     olmesartan-hydrochlorothiazide 40-25 MG per tablet  Commonly known as:  BENICAR HCT  Take 1 tablet by mouth daily.     pantoprazole 40 MG tablet  Commonly known as:  PROTONIX  Take 1 tablet (40 mg total) by mouth daily. 30 minutes before the evening meal     triamterene-hydrochlorothiazide 37.5-25 MG per tablet  Commonly known as:  MAXZIDE-25  Take 1 tablet by mouth daily.     zolpidem 10 MG tablet  Commonly known as:  AMBIEN  Take 2.5 mg by mouth every other day.     ZZZQUIL PO  Take 1 tablet by mouth every other day.        Discharge Instructions: Follow-up Information    Follow up with [facility] On 04/09/2014.   Why:  Doc of the Week clinic, 3:45pm, arrive no later than 3:15pm for paperwork   Contact information:   [address] [place] [address] [phone]       Signed: Henreitta Cea 03/21/2014, 9:52 AM

## 2014-08-29 ENCOUNTER — Ambulatory Visit (INDEPENDENT_AMBULATORY_CARE_PROVIDER_SITE_OTHER): Payer: Medicare Other | Admitting: Family Medicine

## 2014-08-29 ENCOUNTER — Encounter: Payer: Self-pay | Admitting: Family Medicine

## 2014-08-29 VITALS — BP 125/78 | HR 73 | Temp 97.8°F | Ht 67.0 in | Wt 158.0 lb

## 2014-08-29 DIAGNOSIS — R413 Other amnesia: Secondary | ICD-10-CM

## 2014-08-29 DIAGNOSIS — F4329 Adjustment disorder with other symptoms: Secondary | ICD-10-CM | POA: Diagnosis not present

## 2014-08-29 NOTE — Progress Notes (Signed)
Patient ID: Alexis Donovan, female   DOB: 01/16/1941, 74 y.o.   MRN: [id number] [facility]:   Patient is accompanied by: spouse and husband Primary caregiver: patient Patient's lives with their spouse. Patient information was obtained from patient, spouse/SO and past medical records. History/Exam limitations: resistant to idea of needing to be at our clinic. Primary Care Provider: Henrine Screws, MD Referring provider: None Reason for referral: Patient's spouse is concerned about decline in energy and function since Thanksgiving of last year.  Alexis Donovan came for evaluation just to appease her husband per Alexis Donovan.  Chief Complaint  Patient presents with  . Annual Exam    medication, 2nd opinion   History Chief Complaint  Patient presents with  . Annual Exam    medication, 2nd opinion    HPI by problems:  Husband of patient reportsdecrease in his wife's motivation and energy, perhaps in thinking since Thanksgiving of last year.  No preceding difficulties with these issues.  Husband believes that changes are from cognitive decline or depression/grief.   There has been no progression of the postulated changes since Thanksgiving 2015.   Outpatient Encounter Prescriptions as of 08/29/2014  Medication Sig  . aspirin 81 MG tablet Take 81 mg by mouth daily.  . citalopram (CELEXA) 20 MG tablet Take 20 mg by mouth daily. 1/2 tablet  . clonazePAM (KLONOPIN) 0.5 MG tablet Take 0.5 mg by mouth daily as needed for anxiety.   Marland Kitchen estradiol (ESTRACE) 0.5 MG tablet Take 0.5 mg by mouth daily.  Marland Kitchen levothyroxine (SYNTHROID, LEVOTHROID) 112 MCG tablet Take 112 mcg by mouth daily.  Marland Kitchen loratadine (CLARITIN) 10 MG tablet Take 10 mg by mouth daily.  . Red Yeast Rice Extract (RED YEAST RICE PO) Take by mouth daily.  . valsartan-hydrochlorothiazide (DIOVAN-HCT) 160-25 MG per tablet Take 1 tablet by mouth daily.  Marland Kitchen zolpidem (AMBIEN) 10 MG tablet Take 2.5 mg by mouth every  other day.   . beclomethasone (QVAR) 80 MCG/ACT inhaler Inhale 2 puffs into the lungs 2 (two) times daily.  . budesonide-formoterol (SYMBICORT) 160-4.5 MCG/ACT inhaler Inhale 2 puffs into the lungs 2 (two) times daily. (Patient not taking: Reported on 08/29/2014)  . escitalopram (LEXAPRO) 10 MG tablet Take 5 mg by mouth daily.   Marland Kitchen estrogens, conjugated, (PREMARIN) 0.3 MG tablet Take 0.3 mg by mouth daily.   Marland Kitchen HYDROcodone-acetaminophen (NORCO/VICODIN) 5-325 MG per tablet Take 1-2 tablets by mouth every 4 (four) hours as needed for moderate pain or severe pain. (Patient not taking: Reported on 08/29/2014)  . Multiple Vitamin (MULTIVITAMIN) tablet Take 1 tablet by mouth daily.  . [DISCONTINUED] DiphenhydrAMINE HCl, Sleep, (ZZZQUIL PO) Take 1 tablet by mouth every other day.  . [DISCONTINUED] olmesartan-hydrochlorothiazide (BENICAR HCT) 40-25 MG per tablet Take 1 tablet by mouth daily.  . [DISCONTINUED] pantoprazole (PROTONIX) 40 MG tablet Take 1 tablet (40 mg total) by mouth daily. 30 minutes before the evening meal (Patient not taking: Reported on 08/29/2014)  . [DISCONTINUED] triamterene-hydrochlorothiazide (MAXZIDE-25) 37.5-25 MG per tablet Take 1 tablet by mouth daily.   No facility-administered encounter medications on file as of 08/29/2014.   History Patient Active Problem List   Diagnosis Date Noted  . Biliary colic 07/86/7544  . 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 ANXIETY DEPRESSION 11/04/2009  . INTRINSIC ASTHMA, UNSPECIFIED 12/25/2007  . HYPOTHYROIDISM 12/22/2007  . HYPERTENSION 12/22/2007  . ALLERGIC RHINITIS 12/22/2007   Past Medical History  Diagnosis Date  . Thyroid disease   .  Hypertension   . Hypothyroidism    Past Surgical History  Procedure Laterality Date  . Abdominal hysterectomy    . Cholecystectomy  03/20/2014    laproscopic  . Cholecystectomy N/A 03/20/2014    Procedure: LAPAROSCOPIC CHOLECYSTECTOMY;  Surgeon:  Doreen Salvage, MD;  Location: [facility];  Service: General;  Laterality: N/A;   Family History  Problem Relation Age of Onset  . Heart disease Mother   . Heart disease Father   . Heart disease Maternal Grandfather   . Heart disease Paternal Grandfather   . Cancer Daughter     breast   History   Social History  . Marital Status: Married    Spouse Name: N/A  . Number of Children: N/A  . Years of Education: N/A   Social History Main Topics  . Smoking status: Never Smoker   . Smokeless tobacco: Never Used  . Alcohol Use: 4.2 oz/week    7 Glasses of wine per week  . Drug Use: No  . Sexual Activity: Yes   Other Topics Concern  . None   Social History Narrative    Basic Activities of Daily Living  ADLs Independent Needs Assistance Dependent  Bathing x    Dressing x    Ambulation x    Toileting x    Eating x     Instrumental Activities of Daily Living IADL Independent Needs Assistance Dependent  Cooking x    Housework x    Manage Medications x    Manage the telephone x    Shopping for food, clothes, Meds, etc x    Use transportation x    Manage Finances x      Caregivers in home: patient and spouse  Caregiver Burdens: Husband concerned about changes in patient   Falls in the past six months:   Stumble with slippers on stairs. Self corrected.  Health Maintenance reviewed: Immunization History  Administered Date(s) Administered  . Influenza Split 10/25/2012  . Influenza Whole 11/01/2009   Health Maintenance Topics with due status: Due On     Topic Date Due   INFLUENZA VACCINE 08/26/2014   Health Maintenance Topics with due status: Overdue     Topic Date Due   TETANUS/TDAP 11/14/1960   COLONOSCOPY 11/15/1991   ZOSTAVAX 11/14/2001   PNA vac Low Risk Adult 11/15/2006      Geriatric Syndromes: Constipation no  Incontinence no  Dizziness no  Syncope no Visual Impairment no Hearing impairment no  Eating impairment no  Impaired Memory or Cognition no    Behavioral problems no   Sleep problems Yes Weight loss no       Vital Signs Weight: 158 lb (71.668 kg) Body mass index is 24.74 kg/(m^2). CrCl cannot be calculated (Patient has no serum creatinine result on file.). Body surface area is 1.84 meters squared. Filed Vitals:   08/29/14 1351  BP: 125/78  Pulse: 73  Temp: 97.8 F (36.6 C)  TempSrc: Oral  Height: 5\' 7"  (1.702 m)  Weight: 158 lb (71.668 kg)   Wt Readings from Last 3 Encounters:  08/29/14 158 lb (71.668 kg)  03/20/14 165 lb (74.844 kg)  09/12/13 165 lb (74.844 kg)    Physical Examination:  VS reviewed GEN: Alert, Cooperative, Groomed, NAD HEENT: PERRL; EAC bilaterally not occluded, TM's translucent with normal LM, (+) LR;                No cervical LAN, No thyromegaly, No palpable masses COR: RRR, No M/G/R, No JVD, Normal PMI  size and location LUNGS: BCTA, No Acc mm use, speaking in full sentences SKIN: No lesion nor rashes of face Muscle Tone normal; Tremor not present  4-Stage Stand Test:   Feet Tandem: Yes.         Gait: No significant path deviation, Step-through present  Psych: Normal affect/thought/speech/language   Mini-Mental State Examination or Montreal Cognitive Assessment: 27 out of 30 on MoCa ( lost points on Fluency test and delayed recall) Total Score:No flowsheet data found.     No flowsheet data found.  Geriatric Depression Scale:  1 out of 15   Labs  No results found for: VITAMINB12  No results found for: FOLATE  No results found for: TSH  No results found for: RPR    Chemistry      Component Value Date/Time   NA 135 03/20/2014 0535   K 3.3* 03/20/2014 0535   CL 103 03/20/2014 0535   CO2 22 03/20/2014 0535   BUN 19 03/20/2014 0535   CREATININE 0.79 03/20/2014 0535      Component Value Date/Time   CALCIUM 9.2 03/20/2014 0535   ALKPHOS 54 03/20/2014 0535   AST 22 03/20/2014 0535   ALT 14 03/20/2014 0535   BILITOT 0.3 03/20/2014 0535       No results found for:  HGBA1C    Lab Results  Component Value Date   WBC 11.9* 03/20/2014   HGB 12.1 03/20/2014   HCT 36.7 03/20/2014   MCV 91.1 03/20/2014   PLT 246 03/20/2014    No results found for this or any previous visit (from the past 24 hour(s)).  Assessment and Plan: Problem List Items Addressed This Visit    None      Personal Strengths Ability for insight Active sense of humor Average or above average intelligence Communication skills General fund of knowledge Motivation for treatment/growth Physical Health Religious Affiliation Supportive family/friends   Advanced Directives: Code Status: Full code     No follow up scheduled for Alexis Donovan at the [facility]. She will return to her PCP, Dr Inda Merlin as needed.

## 2014-08-30 ENCOUNTER — Encounter: Payer: Self-pay | Admitting: Family Medicine

## 2014-08-30 DIAGNOSIS — F4329 Adjustment disorder with other symptoms: Secondary | ICD-10-CM | POA: Insufficient documentation

## 2014-08-30 DIAGNOSIS — R413 Other amnesia: Secondary | ICD-10-CM

## 2014-08-30 HISTORY — DX: Other amnesia: R41.3

## 2014-08-30 NOTE — Assessment & Plan Note (Addendum)
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  No functional impairment in ADLs or IADLs Discussed remaining active in group learning activities associated less cognitive decline with aging.  Discussed importance of physical activity on cognition.  Discussed detrimental effects of benzodiazepines, first generation antihistamines / OTC sleep aids, and Zolpidem on cognition and instability in older adults as they age. Options for primary insomnia in older adults include Melatonin / Rameleon, Silenor 3 mg or 6 mg dose at bedtime (doxepin comes in less expensive 10 mg/ml liquid), trazodone 25 to 100 mg at bedtime, and mirtazapine 7.5 to 15 mg at bedtime. Of the "Z" hypnotics, Zaleplon has the lowest incidence of next-day drowsiness.      No further workup recommended. No further follow up at [facility] needed. Further monitoring of Cognition per her PCP as they see fit.

## 2014-08-30 NOTE — Assessment & Plan Note (Signed)
The changes that have occurred to both Alexis and Alexis Donovan over last 9 months may be more related to memories of the trauma they experienced from the care of their daughter during her fight with breast cancer.  Alexis Donovan has good social support system in place which she utilizes. We encourage her (and her husband) to take advantage of both the resources for counseling available to them from [place].

## 2014-10-19 LAB — COLOGUARD: Cologuard: NEGATIVE

## 2015-04-02 ENCOUNTER — Other Ambulatory Visit: Payer: Self-pay

## 2015-04-02 DIAGNOSIS — Z1231 Encounter for screening mammogram for malignant neoplasm of breast: Secondary | ICD-10-CM

## 2015-04-18 ENCOUNTER — Ambulatory Visit
Admission: RE | Admit: 2015-04-18 | Discharge: 2015-04-18 | Disposition: A | Discharge status: Home / Self Care | Payer: Medicare Other | Source: Ambulatory Visit

## 2015-04-18 DIAGNOSIS — Z1231 Encounter for screening mammogram for malignant neoplasm of breast: Secondary | ICD-10-CM

## 2015-12-24 ENCOUNTER — Ambulatory Visit
Admission: RE | Admit: 2015-12-24 | Discharge: 2015-12-24 | Disposition: A | Discharge status: Home / Self Care | Payer: Medicare Other | Source: Ambulatory Visit | Attending: Internal Medicine | Admitting: Internal Medicine

## 2015-12-24 ENCOUNTER — Other Ambulatory Visit: Payer: Self-pay | Admitting: Internal Medicine

## 2015-12-24 DIAGNOSIS — M545 Low back pain: Secondary | ICD-10-CM

## 2016-04-01 ENCOUNTER — Other Ambulatory Visit: Payer: Self-pay | Admitting: Internal Medicine

## 2016-04-01 DIAGNOSIS — Z1231 Encounter for screening mammogram for malignant neoplasm of breast: Secondary | ICD-10-CM

## 2016-04-20 ENCOUNTER — Ambulatory Visit: Payer: Medicare Other

## 2016-05-05 ENCOUNTER — Ambulatory Visit
Admission: RE | Admit: 2016-05-05 | Discharge: 2016-05-05 | Disposition: A | Discharge status: Home / Self Care | Payer: Medicare HMO | Source: Ambulatory Visit | Attending: Internal Medicine | Admitting: Internal Medicine

## 2016-05-05 DIAGNOSIS — Z1231 Encounter for screening mammogram for malignant neoplasm of breast: Secondary | ICD-10-CM

## 2016-07-27 DIAGNOSIS — L720 Epidermal cyst: Secondary | ICD-10-CM | POA: Diagnosis not present

## 2016-07-27 DIAGNOSIS — L570 Actinic keratosis: Secondary | ICD-10-CM | POA: Diagnosis not present

## 2016-07-27 DIAGNOSIS — L820 Inflamed seborrheic keratosis: Secondary | ICD-10-CM | POA: Diagnosis not present

## 2016-07-27 DIAGNOSIS — L821 Other seborrheic keratosis: Secondary | ICD-10-CM | POA: Diagnosis not present

## 2016-11-01 ENCOUNTER — Other Ambulatory Visit: Payer: Self-pay | Admitting: Internal Medicine

## 2016-11-01 DIAGNOSIS — E039 Hypothyroidism, unspecified: Secondary | ICD-10-CM | POA: Diagnosis not present

## 2016-11-01 DIAGNOSIS — R112 Nausea with vomiting, unspecified: Secondary | ICD-10-CM | POA: Diagnosis not present

## 2016-11-01 DIAGNOSIS — Z79899 Other long term (current) drug therapy: Secondary | ICD-10-CM | POA: Diagnosis not present

## 2016-11-01 DIAGNOSIS — E7800 Pure hypercholesterolemia, unspecified: Secondary | ICD-10-CM | POA: Diagnosis not present

## 2016-11-01 DIAGNOSIS — I10 Essential (primary) hypertension: Secondary | ICD-10-CM | POA: Diagnosis not present

## 2016-11-01 DIAGNOSIS — E559 Vitamin D deficiency, unspecified: Secondary | ICD-10-CM | POA: Diagnosis not present

## 2016-11-03 DIAGNOSIS — R197 Diarrhea, unspecified: Secondary | ICD-10-CM | POA: Diagnosis not present

## 2016-11-03 DIAGNOSIS — R112 Nausea with vomiting, unspecified: Secondary | ICD-10-CM | POA: Diagnosis not present

## 2016-11-24 DIAGNOSIS — I10 Essential (primary) hypertension: Secondary | ICD-10-CM | POA: Diagnosis not present

## 2016-11-24 DIAGNOSIS — Z0000 Encounter for general adult medical examination without abnormal findings: Secondary | ICD-10-CM | POA: Diagnosis not present

## 2016-11-24 DIAGNOSIS — J40 Bronchitis, not specified as acute or chronic: Secondary | ICD-10-CM | POA: Diagnosis not present

## 2016-11-24 DIAGNOSIS — I700 Atherosclerosis of aorta: Secondary | ICD-10-CM | POA: Diagnosis not present

## 2016-11-24 DIAGNOSIS — R112 Nausea with vomiting, unspecified: Secondary | ICD-10-CM | POA: Diagnosis not present

## 2016-11-24 DIAGNOSIS — R69 Illness, unspecified: Secondary | ICD-10-CM | POA: Diagnosis not present

## 2016-11-24 DIAGNOSIS — Z79899 Other long term (current) drug therapy: Secondary | ICD-10-CM | POA: Diagnosis not present

## 2016-11-24 DIAGNOSIS — Z1389 Encounter for screening for other disorder: Secondary | ICD-10-CM | POA: Diagnosis not present

## 2016-11-24 DIAGNOSIS — Z0001 Encounter for general adult medical examination with abnormal findings: Secondary | ICD-10-CM | POA: Diagnosis not present

## 2016-11-24 DIAGNOSIS — E039 Hypothyroidism, unspecified: Secondary | ICD-10-CM | POA: Diagnosis not present

## 2016-12-01 DIAGNOSIS — R69 Illness, unspecified: Secondary | ICD-10-CM | POA: Diagnosis not present

## 2017-01-10 DIAGNOSIS — E039 Hypothyroidism, unspecified: Secondary | ICD-10-CM | POA: Diagnosis not present

## 2017-01-10 DIAGNOSIS — A048 Other specified bacterial intestinal infections: Secondary | ICD-10-CM | POA: Diagnosis not present

## 2017-02-18 DIAGNOSIS — M1990 Unspecified osteoarthritis, unspecified site: Secondary | ICD-10-CM | POA: Diagnosis not present

## 2017-02-18 DIAGNOSIS — E785 Hyperlipidemia, unspecified: Secondary | ICD-10-CM | POA: Diagnosis not present

## 2017-02-18 DIAGNOSIS — E039 Hypothyroidism, unspecified: Secondary | ICD-10-CM | POA: Diagnosis not present

## 2017-02-18 DIAGNOSIS — J40 Bronchitis, not specified as acute or chronic: Secondary | ICD-10-CM | POA: Diagnosis not present

## 2017-02-18 DIAGNOSIS — J4520 Mild intermittent asthma, uncomplicated: Secondary | ICD-10-CM | POA: Diagnosis not present

## 2017-02-18 DIAGNOSIS — I10 Essential (primary) hypertension: Secondary | ICD-10-CM | POA: Diagnosis not present

## 2017-03-30 DIAGNOSIS — R69 Illness, unspecified: Secondary | ICD-10-CM | POA: Diagnosis not present

## 2017-05-17 DIAGNOSIS — R69 Illness, unspecified: Secondary | ICD-10-CM | POA: Diagnosis not present

## 2017-06-06 ENCOUNTER — Other Ambulatory Visit: Payer: Self-pay | Admitting: Internal Medicine

## 2017-06-06 DIAGNOSIS — Z1231 Encounter for screening mammogram for malignant neoplasm of breast: Secondary | ICD-10-CM

## 2017-07-12 ENCOUNTER — Ambulatory Visit
Admission: RE | Admit: 2017-07-12 | Discharge: 2017-07-12 | Disposition: A | Discharge status: Home / Self Care | Payer: Medicare HMO | Source: Ambulatory Visit | Attending: Internal Medicine | Admitting: Internal Medicine

## 2017-07-12 DIAGNOSIS — Z1231 Encounter for screening mammogram for malignant neoplasm of breast: Secondary | ICD-10-CM | POA: Diagnosis not present

## 2017-07-20 DIAGNOSIS — I10 Essential (primary) hypertension: Secondary | ICD-10-CM | POA: Diagnosis not present

## 2017-07-20 DIAGNOSIS — E039 Hypothyroidism, unspecified: Secondary | ICD-10-CM | POA: Diagnosis not present

## 2017-07-20 DIAGNOSIS — E785 Hyperlipidemia, unspecified: Secondary | ICD-10-CM | POA: Diagnosis not present

## 2017-07-20 DIAGNOSIS — M1990 Unspecified osteoarthritis, unspecified site: Secondary | ICD-10-CM | POA: Diagnosis not present

## 2017-07-20 DIAGNOSIS — J40 Bronchitis, not specified as acute or chronic: Secondary | ICD-10-CM | POA: Diagnosis not present

## 2017-07-20 DIAGNOSIS — J4520 Mild intermittent asthma, uncomplicated: Secondary | ICD-10-CM | POA: Diagnosis not present

## 2017-09-12 ENCOUNTER — Other Ambulatory Visit: Payer: Self-pay | Admitting: Internal Medicine

## 2017-09-12 DIAGNOSIS — R5383 Other fatigue: Secondary | ICD-10-CM

## 2017-09-12 DIAGNOSIS — R1013 Epigastric pain: Secondary | ICD-10-CM | POA: Diagnosis not present

## 2017-09-12 DIAGNOSIS — Z8249 Family history of ischemic heart disease and other diseases of the circulatory system: Secondary | ICD-10-CM

## 2017-09-12 DIAGNOSIS — R110 Nausea: Secondary | ICD-10-CM | POA: Diagnosis not present

## 2017-09-12 DIAGNOSIS — R195 Other fecal abnormalities: Secondary | ICD-10-CM

## 2017-09-12 DIAGNOSIS — E7800 Pure hypercholesterolemia, unspecified: Secondary | ICD-10-CM | POA: Diagnosis not present

## 2017-09-21 ENCOUNTER — Ambulatory Visit
Admission: RE | Admit: 2017-09-21 | Discharge: 2017-09-21 | Disposition: A | Discharge status: Home / Self Care | Payer: Medicare HMO | Source: Ambulatory Visit | Attending: Internal Medicine | Admitting: Internal Medicine

## 2017-09-21 DIAGNOSIS — Z8249 Family history of ischemic heart disease and other diseases of the circulatory system: Secondary | ICD-10-CM

## 2017-09-21 DIAGNOSIS — R195 Other fecal abnormalities: Secondary | ICD-10-CM

## 2017-09-21 DIAGNOSIS — R1013 Epigastric pain: Secondary | ICD-10-CM

## 2017-09-21 DIAGNOSIS — R5383 Other fatigue: Secondary | ICD-10-CM

## 2017-09-21 DIAGNOSIS — R112 Nausea with vomiting, unspecified: Secondary | ICD-10-CM | POA: Diagnosis not present

## 2017-09-23 DIAGNOSIS — R5383 Other fatigue: Secondary | ICD-10-CM | POA: Diagnosis not present

## 2017-09-23 DIAGNOSIS — Z8249 Family history of ischemic heart disease and other diseases of the circulatory system: Secondary | ICD-10-CM | POA: Diagnosis not present

## 2017-09-23 DIAGNOSIS — J309 Allergic rhinitis, unspecified: Secondary | ICD-10-CM | POA: Diagnosis not present

## 2017-09-23 DIAGNOSIS — R1013 Epigastric pain: Secondary | ICD-10-CM | POA: Diagnosis not present

## 2017-09-23 DIAGNOSIS — R110 Nausea: Secondary | ICD-10-CM | POA: Diagnosis not present

## 2017-09-23 DIAGNOSIS — R195 Other fecal abnormalities: Secondary | ICD-10-CM | POA: Diagnosis not present

## 2017-10-18 DIAGNOSIS — R69 Illness, unspecified: Secondary | ICD-10-CM | POA: Diagnosis not present

## 2017-11-17 DIAGNOSIS — H04123 Dry eye syndrome of bilateral lacrimal glands: Secondary | ICD-10-CM | POA: Diagnosis not present

## 2017-11-17 DIAGNOSIS — H524 Presbyopia: Secondary | ICD-10-CM | POA: Diagnosis not present

## 2017-11-17 DIAGNOSIS — H2513 Age-related nuclear cataract, bilateral: Secondary | ICD-10-CM | POA: Diagnosis not present

## 2017-11-18 ENCOUNTER — Ambulatory Visit: Payer: Medicare HMO | Admitting: Interventional Cardiology

## 2017-11-18 ENCOUNTER — Encounter: Payer: Self-pay | Admitting: Interventional Cardiology

## 2017-11-18 VITALS — BP 116/76 | HR 66 | Ht 67.0 in | Wt 161.0 lb

## 2017-11-18 DIAGNOSIS — Z8249 Family history of ischemic heart disease and other diseases of the circulatory system: Secondary | ICD-10-CM | POA: Diagnosis not present

## 2017-11-18 DIAGNOSIS — R5382 Chronic fatigue, unspecified: Secondary | ICD-10-CM | POA: Diagnosis not present

## 2017-11-18 DIAGNOSIS — I4510 Unspecified right bundle-branch block: Secondary | ICD-10-CM

## 2017-11-18 NOTE — Patient Instructions (Signed)

## 2017-11-18 NOTE — Progress Notes (Signed)
Cardiology Office Note   Date:  11/18/2017   ID:  Alexis Donovan, DOB [birth year], MRN [id number]  PCP:  Josetta Huddle, MD    No chief complaint on file.  FH CAD  Wt Readings from Last 3 Encounters:  11/18/17 161 lb (73 kg)  08/29/14 158 lb (71.7 kg)  03/20/14 165 lb (74.8 kg)       History of Present Illness: Alexis Donovan is a 77 y.o. female who is being seen today for the evaluation of persistent fatigue and family history of coronary artery disease at the request of Josetta Huddle, MD.  She was seen by her primary care doctor and sent here for further evaluation.  She has a history of palpitations as well.  She was recently started on Nexium and a probiotic to help with persistent loose stool.  An abdominal ultrasound was also check along with blood work.  She was also screened for an abdominal aortic aneurysm.  Labs revealed normal hemoglobin, normal white count.  She wanted to be seen by a cardiologist so she was established in the system.   Her parents had heart problems and both died at 25.  Denies : Chest pain. Dizziness. Leg edema. Nitroglycerin use. Orthopnea. Paroxysmal nocturnal dyspnea. Shortness of breath. Syncope.   She feels occasional palpitations.    She does yoga twice a week .  She does not like cardio exercises.  She tires more easily.  Her husband is very active.      Past Medical History:  Diagnosis Date  . 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 Hypertension   . Hypothyroidism   . Thyroid disease     Past Surgical History:  Procedure Laterality Date  . ABDOMINAL HYSTERECTOMY    . CHOLECYSTECTOMY  03/20/2014   laproscopic  . CHOLECYSTECTOMY N/A 03/20/2014   Procedure: LAPAROSCOPIC CHOLECYSTECTOMY;  Surgeon: Doreen Salvage, MD;  Location: [facility];  Service: General;  Laterality: N/A;     Current Outpatient Medications  Medication Sig Dispense Refill  . budesonide-formoterol (SYMBICORT) 160-4.5 MCG/ACT inhaler Inhale 2 puffs into the  lungs 2 (two) times daily. 1 Inhaler 6  . citalopram (CELEXA) 20 MG tablet Take 20 mg by mouth daily. 1/2 tablet    . clonazePAM (KLONOPIN) 0.5 MG tablet Take 0.5 mg by mouth daily as needed for anxiety.     . Cyanocobalamin (VITAMIN B12 PO) Take 5,000 Units by mouth daily.    Marland Kitchen estradiol (ESTRACE) 0.5 MG tablet Take 0.5 mg by mouth daily.    Marland Kitchen levothyroxine (SYNTHROID, LEVOTHROID) 112 MCG tablet Take 112 mcg by mouth daily.    . Red Yeast Rice Extract (RED YEAST RICE PO) Take by mouth daily.    . valsartan-hydrochlorothiazide (DIOVAN-HCT) 160-25 MG per tablet Take 1 tablet by mouth daily.    Marland Kitchen zolpidem (AMBIEN) 10 MG tablet Take 2.5 mg by mouth every other day.      No current facility-administered medications for this visit.     Allergies:   Patient has no known allergies.    Social History:  The patient  reports that she has never smoked. She has never used smokeless tobacco. She reports that she drinks about 7.0 standard drinks of alcohol per week. She reports that she does not use drugs.   Family History:  The patient's family history includes Breast cancer (age of onset: 76) in her daughter; Cancer in her daughter; Heart disease in her father, maternal grandfather, mother, and paternal grandfather.    ROS:  Please see the history of present illness.   Otherwise, review of systems are positive for fatigue.   All other systems are reviewed and negative.    PHYSICAL EXAM: VS:  BP 116/76   Pulse 66   Ht 5\' 7"  (1.702 m)   Wt 161 lb (73 kg)   SpO2 96%   BMI 25.22 kg/m  , BMI Body mass index is 25.22 kg/m. GEN: Well nourished, well developed, in no acute distress  HEENT: normal  Neck: no JVD, carotid bruits, or masses Cardiac: RRR; no murmurs, rubs, or gallops,no edema  Respiratory:  clear to auscultation bilaterally, normal work of breathing GI: soft, nontender, nondistended, + BS MS: no deformity or atrophy  Skin: warm and dry, no rash Neuro:  Strength and sensation are  intact Psych: euthymic mood, full affect   EKG:   The ekg ordered today demonstrates NSR RBBB   Recent Labs: No results found for requested labs within last 8760 hours.   Lipid Panel No results found for: CHOL, TRIG, HDL, CHOLHDL, VLDL, LDLCALC, LDLDIRECT   Other studies Reviewed: Additional studies/ records that were reviewed today with results demonstrating: Prior echo.   ASSESSMENT AND PLAN:  1. Family h/o CAD: We discussed RF modification.  LDL target 130.  Increased exercise and healthy diet. No angina on medical therapy. If she has problems tolerating regular exercise, she will let us know.   2. Persistent fatigue: Likely due to deconditioning.  We discussed ET but she wants to start walking on her own.   3. Checking for AAA per Dr. Inda Merlin. 4. RBBB: Chronic.   Current medicines are reviewed at length with the patient today.  The patient concerns regarding her medicines were addressed.  The following changes have been made:  No change  Labs/ tests ordered today include:  No orders of the defined types were placed in this encounter.   Recommend 150 minutes/week of aerobic exercise Low fat, low carb, high fiber diet recommended  Disposition:   FU in 1 year   Signed, Larae Grooms, MD  11/18/2017 2:27 PM    [facility] [place], [place], [place]  [address] Phone: [phone]; Fax: [phone]

## 2017-11-29 DIAGNOSIS — R69 Illness, unspecified: Secondary | ICD-10-CM | POA: Diagnosis not present

## 2017-12-26 DIAGNOSIS — R195 Other fecal abnormalities: Secondary | ICD-10-CM | POA: Diagnosis not present

## 2017-12-26 DIAGNOSIS — R110 Nausea: Secondary | ICD-10-CM | POA: Diagnosis not present

## 2018-01-03 DIAGNOSIS — R195 Other fecal abnormalities: Secondary | ICD-10-CM | POA: Diagnosis not present

## 2018-01-04 DIAGNOSIS — Z8249 Family history of ischemic heart disease and other diseases of the circulatory system: Secondary | ICD-10-CM | POA: Diagnosis not present

## 2018-01-04 DIAGNOSIS — I10 Essential (primary) hypertension: Secondary | ICD-10-CM | POA: Diagnosis not present

## 2018-01-04 DIAGNOSIS — R5383 Other fatigue: Secondary | ICD-10-CM | POA: Diagnosis not present

## 2018-01-04 DIAGNOSIS — Z1389 Encounter for screening for other disorder: Secondary | ICD-10-CM | POA: Diagnosis not present

## 2018-01-04 DIAGNOSIS — Z0000 Encounter for general adult medical examination without abnormal findings: Secondary | ICD-10-CM | POA: Diagnosis not present

## 2018-01-04 DIAGNOSIS — E559 Vitamin D deficiency, unspecified: Secondary | ICD-10-CM | POA: Diagnosis not present

## 2018-01-04 DIAGNOSIS — I700 Atherosclerosis of aorta: Secondary | ICD-10-CM | POA: Diagnosis not present

## 2018-01-04 DIAGNOSIS — E7800 Pure hypercholesterolemia, unspecified: Secondary | ICD-10-CM | POA: Diagnosis not present

## 2018-01-04 DIAGNOSIS — J309 Allergic rhinitis, unspecified: Secondary | ICD-10-CM | POA: Diagnosis not present

## 2018-01-04 DIAGNOSIS — E039 Hypothyroidism, unspecified: Secondary | ICD-10-CM | POA: Diagnosis not present

## 2018-01-04 DIAGNOSIS — R195 Other fecal abnormalities: Secondary | ICD-10-CM | POA: Diagnosis not present

## 2018-01-21 DIAGNOSIS — J209 Acute bronchitis, unspecified: Secondary | ICD-10-CM | POA: Diagnosis not present

## 2018-01-31 DIAGNOSIS — J0100 Acute maxillary sinusitis, unspecified: Secondary | ICD-10-CM | POA: Diagnosis not present

## 2018-01-31 DIAGNOSIS — J209 Acute bronchitis, unspecified: Secondary | ICD-10-CM | POA: Diagnosis not present

## 2018-01-31 DIAGNOSIS — J309 Allergic rhinitis, unspecified: Secondary | ICD-10-CM | POA: Diagnosis not present

## 2018-02-09 ENCOUNTER — Ambulatory Visit
Admission: RE | Admit: 2018-02-09 | Discharge: 2018-02-09 | Disposition: A | Discharge status: Home / Self Care | Payer: Medicare HMO | Source: Ambulatory Visit | Attending: Geriatric Medicine | Admitting: Geriatric Medicine

## 2018-02-09 ENCOUNTER — Other Ambulatory Visit: Payer: Self-pay | Admitting: Geriatric Medicine

## 2018-02-09 DIAGNOSIS — R058 Other specified cough: Secondary | ICD-10-CM

## 2018-02-09 DIAGNOSIS — R05 Cough: Secondary | ICD-10-CM

## 2018-02-13 DIAGNOSIS — R69 Illness, unspecified: Secondary | ICD-10-CM | POA: Diagnosis not present

## 2018-02-20 DIAGNOSIS — R197 Diarrhea, unspecified: Secondary | ICD-10-CM | POA: Diagnosis not present

## 2018-03-13 DIAGNOSIS — R05 Cough: Secondary | ICD-10-CM | POA: Diagnosis not present

## 2018-04-01 DIAGNOSIS — B349 Viral infection, unspecified: Secondary | ICD-10-CM | POA: Diagnosis not present

## 2018-04-01 DIAGNOSIS — D72829 Elevated white blood cell count, unspecified: Secondary | ICD-10-CM | POA: Diagnosis not present

## 2018-04-02 DIAGNOSIS — D72829 Elevated white blood cell count, unspecified: Secondary | ICD-10-CM | POA: Diagnosis not present

## 2018-04-02 DIAGNOSIS — J069 Acute upper respiratory infection, unspecified: Secondary | ICD-10-CM | POA: Diagnosis not present

## 2018-05-23 DIAGNOSIS — L821 Other seborrheic keratosis: Secondary | ICD-10-CM | POA: Diagnosis not present

## 2018-05-23 DIAGNOSIS — D2271 Melanocytic nevi of right lower limb, including hip: Secondary | ICD-10-CM | POA: Diagnosis not present

## 2018-05-23 DIAGNOSIS — D1801 Hemangioma of skin and subcutaneous tissue: Secondary | ICD-10-CM | POA: Diagnosis not present

## 2018-05-23 DIAGNOSIS — L718 Other rosacea: Secondary | ICD-10-CM | POA: Diagnosis not present

## 2018-05-23 DIAGNOSIS — D2272 Melanocytic nevi of left lower limb, including hip: Secondary | ICD-10-CM | POA: Diagnosis not present

## 2018-08-03 DIAGNOSIS — R69 Illness, unspecified: Secondary | ICD-10-CM | POA: Diagnosis not present

## 2018-08-07 DIAGNOSIS — R69 Illness, unspecified: Secondary | ICD-10-CM | POA: Diagnosis not present

## 2018-08-22 DIAGNOSIS — Z1212 Encounter for screening for malignant neoplasm of rectum: Secondary | ICD-10-CM | POA: Diagnosis not present

## 2018-08-22 DIAGNOSIS — Z1211 Encounter for screening for malignant neoplasm of colon: Secondary | ICD-10-CM | POA: Diagnosis not present

## 2018-08-22 LAB — COLOGUARD: Cologuard: POSITIVE — AB

## 2018-09-13 DIAGNOSIS — R195 Other fecal abnormalities: Secondary | ICD-10-CM | POA: Diagnosis not present

## 2018-10-04 IMAGING — MG DIGITAL SCREENING BILATERAL MAMMOGRAM WITH TOMO AND CAD
8 series · 9 of 24 positions shown · non-contrast
Comparison: Previous exam(s).

CLINICAL DATA: Screening.

EXAM:
DIGITAL SCREENING BILATERAL MAMMOGRAM WITH TOMO AND CAD

[L CC synth-2D]
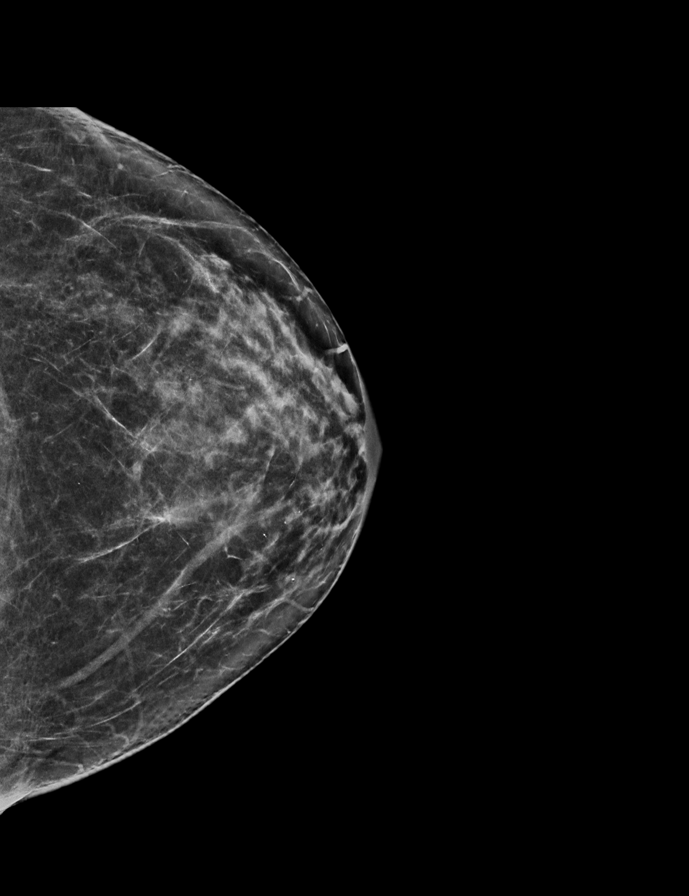

[R MLO synth-2D]
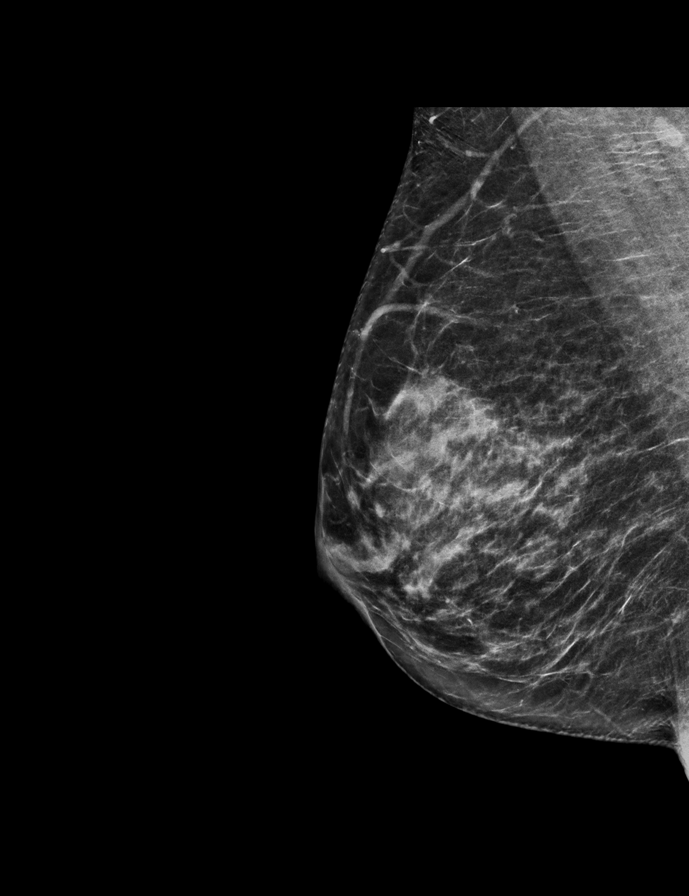

[R CC synth-2D]
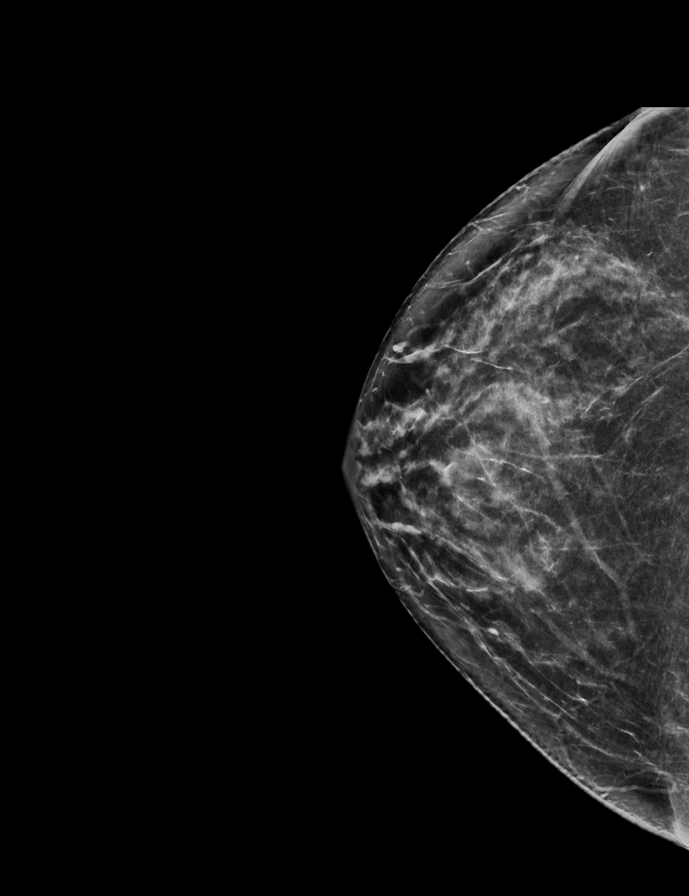

[L MLO synth-2D]
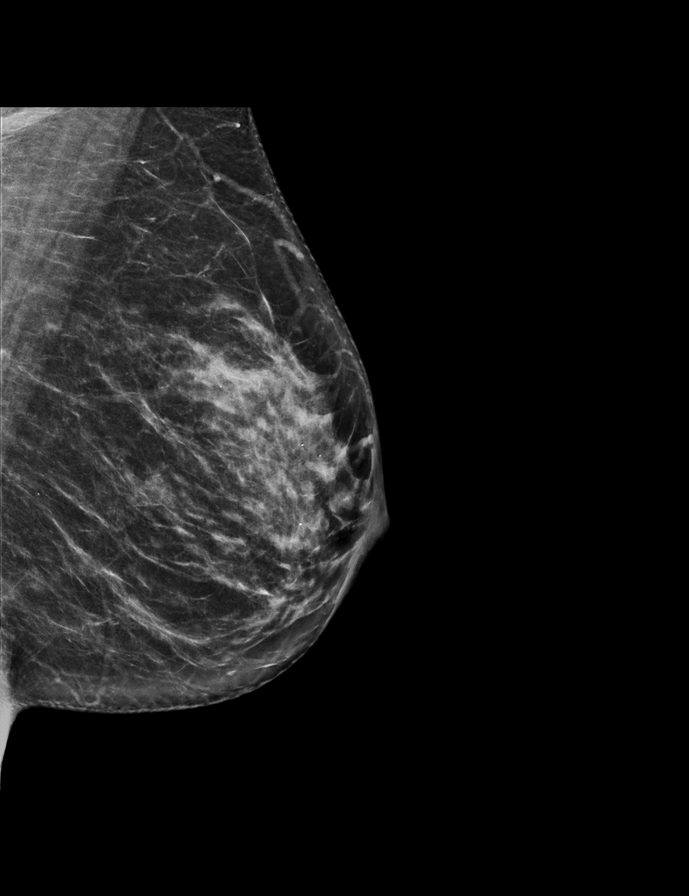

[R CC tomo · 2 of 66 frames shown]
[frame 22/66]
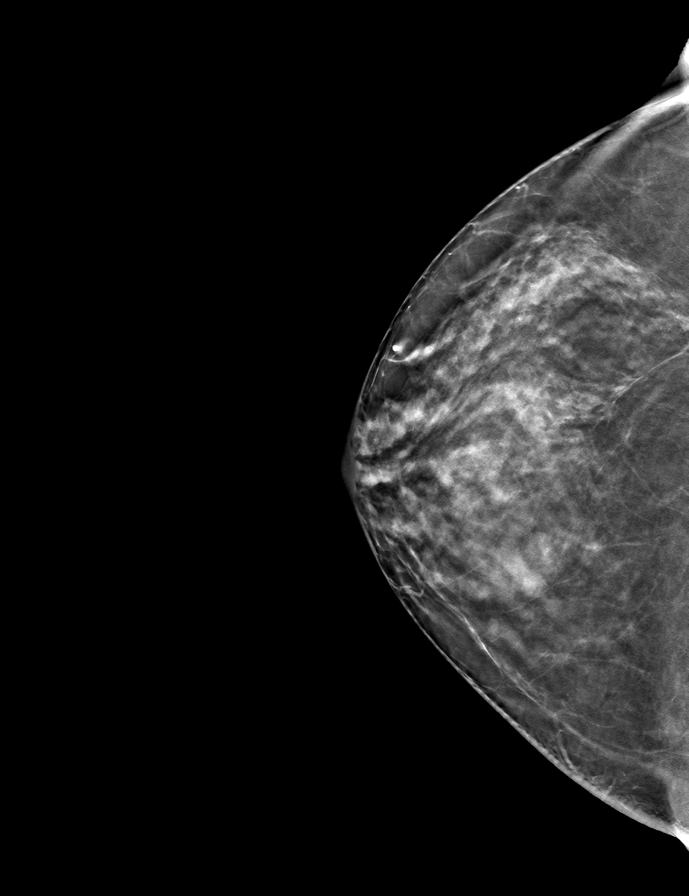
[frame 33/66]
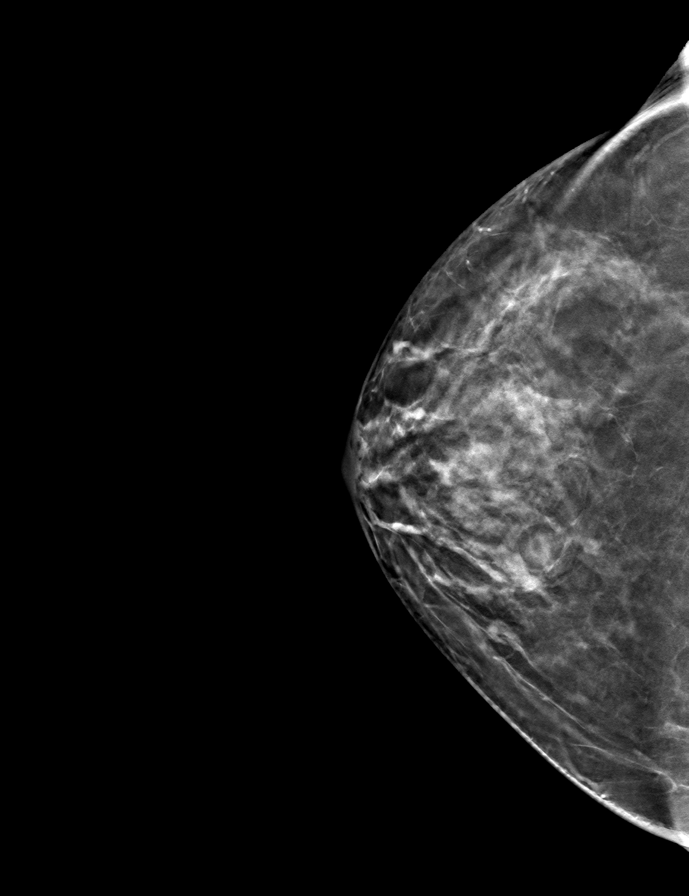

[R MLO tomo · tomo slice 33/64.0]
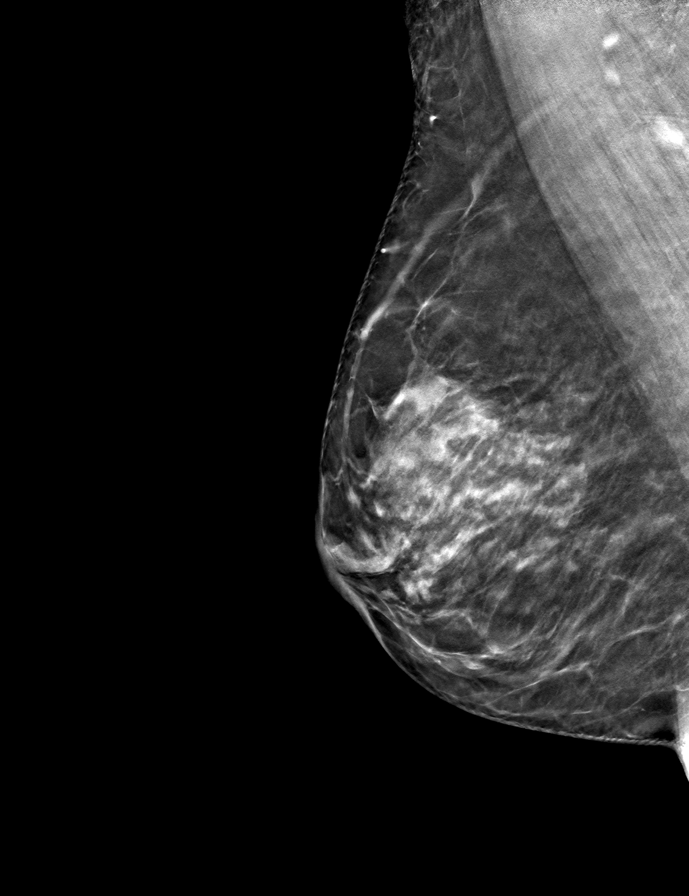

[L MLO tomo · tomo slice 33/66.0]
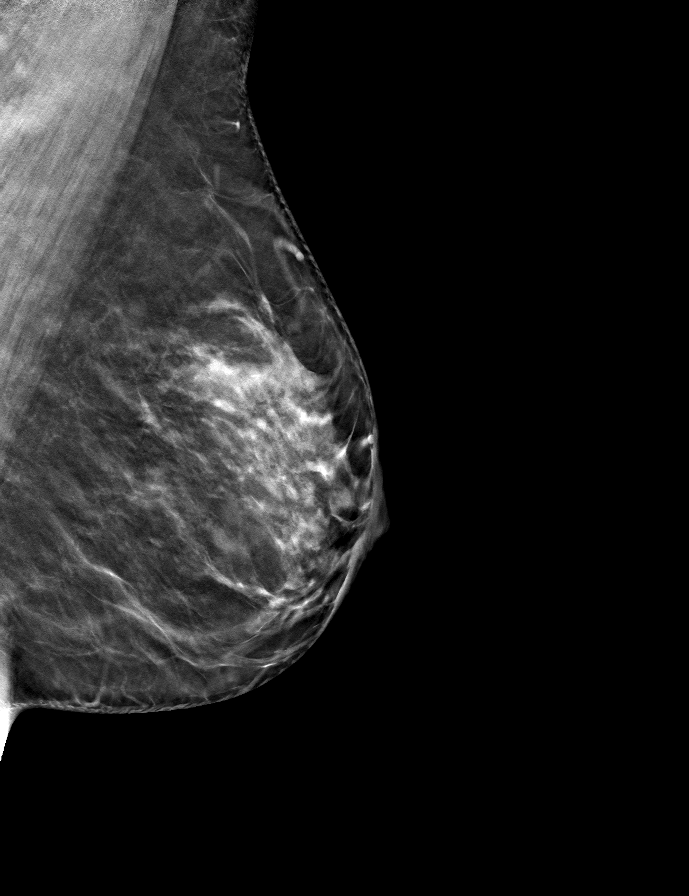

[L CC tomo · tomo slice 32/63.0]
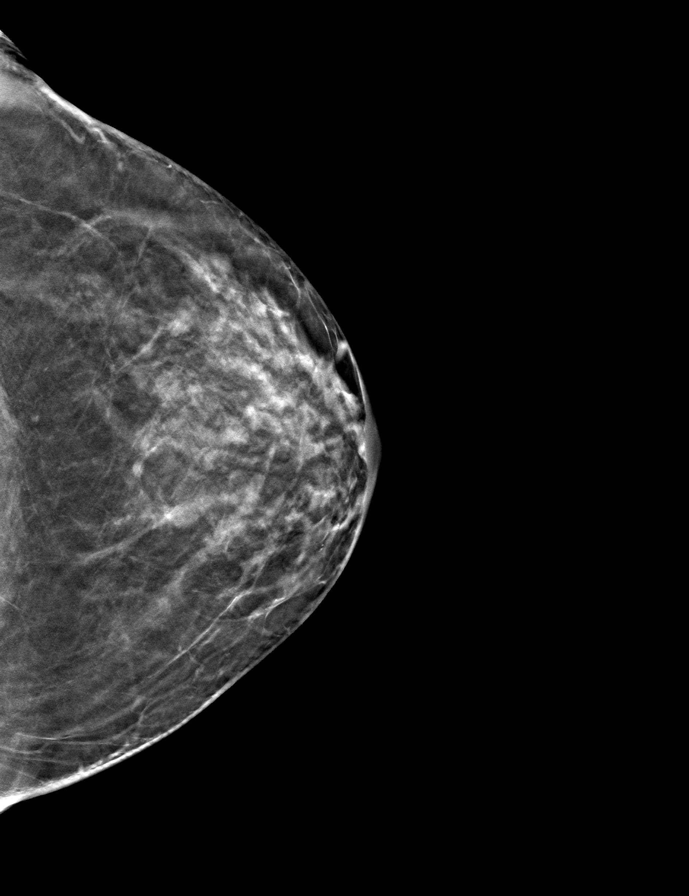

[9 of 24 positions shown; findings below may reference images not displayed]

ACR Breast Density Category b: There are scattered areas of
fibroglandular density.
FINDINGS: There are no findings suspicious for malignancy. Images were
processed with CAD.
IMPRESSION: No mammographic evidence of malignancy. A result letter of this
screening mammogram will be mailed directly to the patient.

RECOMMENDATION:
Screening mammogram in one year. (Code:CN-U-775)

BI-RADS CATEGORY  1: Negative.

## 2018-10-08 DIAGNOSIS — J069 Acute upper respiratory infection, unspecified: Secondary | ICD-10-CM | POA: Diagnosis not present

## 2018-10-08 DIAGNOSIS — J029 Acute pharyngitis, unspecified: Secondary | ICD-10-CM | POA: Diagnosis not present

## 2018-10-11 DIAGNOSIS — Z20828 Contact with and (suspected) exposure to other viral communicable diseases: Secondary | ICD-10-CM | POA: Diagnosis not present

## 2018-10-20 ENCOUNTER — Other Ambulatory Visit: Payer: Self-pay | Admitting: Internal Medicine

## 2018-10-20 DIAGNOSIS — Z1231 Encounter for screening mammogram for malignant neoplasm of breast: Secondary | ICD-10-CM

## 2018-10-23 DIAGNOSIS — Z1159 Encounter for screening for other viral diseases: Secondary | ICD-10-CM | POA: Diagnosis not present

## 2018-10-26 DIAGNOSIS — R195 Other fecal abnormalities: Secondary | ICD-10-CM | POA: Diagnosis not present

## 2018-10-26 DIAGNOSIS — K5730 Diverticulosis of large intestine without perforation or abscess without bleeding: Secondary | ICD-10-CM | POA: Diagnosis not present

## 2018-10-26 DIAGNOSIS — D120 Benign neoplasm of cecum: Secondary | ICD-10-CM | POA: Diagnosis not present

## 2018-10-26 DIAGNOSIS — R197 Diarrhea, unspecified: Secondary | ICD-10-CM | POA: Diagnosis not present

## 2018-10-29 DIAGNOSIS — R69 Illness, unspecified: Secondary | ICD-10-CM | POA: Diagnosis not present

## 2018-11-01 DIAGNOSIS — D120 Benign neoplasm of cecum: Secondary | ICD-10-CM | POA: Diagnosis not present

## 2018-11-23 DIAGNOSIS — R05 Cough: Secondary | ICD-10-CM | POA: Diagnosis not present

## 2018-12-03 ENCOUNTER — Other Ambulatory Visit: Payer: Self-pay

## 2018-12-03 ENCOUNTER — Emergency Department (HOSPITAL_COMMUNITY)
Admission: EM | Admit: 2018-12-03 | Discharge: 2018-12-03 | Disposition: A | Discharge status: Home / Self Care | Payer: Medicare HMO | Attending: Emergency Medicine | Admitting: Emergency Medicine

## 2018-12-03 ENCOUNTER — Encounter (HOSPITAL_COMMUNITY): Payer: Self-pay | Admitting: Emergency Medicine

## 2018-12-03 DIAGNOSIS — R112 Nausea with vomiting, unspecified: Secondary | ICD-10-CM | POA: Diagnosis not present

## 2018-12-03 DIAGNOSIS — Z79899 Other long term (current) drug therapy: Secondary | ICD-10-CM | POA: Insufficient documentation

## 2018-12-03 DIAGNOSIS — R1111 Vomiting without nausea: Secondary | ICD-10-CM | POA: Diagnosis not present

## 2018-12-03 DIAGNOSIS — R197 Diarrhea, unspecified: Secondary | ICD-10-CM | POA: Insufficient documentation

## 2018-12-03 DIAGNOSIS — E039 Hypothyroidism, unspecified: Secondary | ICD-10-CM | POA: Diagnosis not present

## 2018-12-03 DIAGNOSIS — I10 Essential (primary) hypertension: Secondary | ICD-10-CM | POA: Insufficient documentation

## 2018-12-03 DIAGNOSIS — R1084 Generalized abdominal pain: Secondary | ICD-10-CM | POA: Diagnosis not present

## 2018-12-03 DIAGNOSIS — R52 Pain, unspecified: Secondary | ICD-10-CM | POA: Diagnosis not present

## 2018-12-03 DIAGNOSIS — R5381 Other malaise: Secondary | ICD-10-CM | POA: Diagnosis not present

## 2018-12-03 LAB — URINALYSIS, ROUTINE W REFLEX MICROSCOPIC
Bilirubin Urine: NEGATIVE
Glucose, UA: NEGATIVE mg/dL
Ketones, ur: NEGATIVE mg/dL
Nitrite: NEGATIVE
Protein, ur: NEGATIVE mg/dL
Specific Gravity, Urine: 1.017 (ref 1.005–1.030)
pH: 5 (ref 5.0–8.0)

## 2018-12-03 LAB — COMPREHENSIVE METABOLIC PANEL
ALT: 16 U/L (ref 0–44)
AST: 23 U/L (ref 15–41)
Albumin: 4.2 g/dL (ref 3.5–5.0)
Alkaline Phosphatase: 57 U/L (ref 38–126)
Anion gap: 16 — ABNORMAL HIGH (ref 5–15)
BUN: 18 mg/dL (ref 8–23)
CO2: 20 mmol/L — ABNORMAL LOW (ref 22–32)
Calcium: 9.9 mg/dL (ref 8.9–10.3)
Chloride: 101 mmol/L (ref 98–111)
Creatinine, Ser: 0.96 mg/dL (ref 0.44–1.00)
GFR calc Af Amer: >60 mL/min (ref >60)
GFR calc non Af Amer: 57 mL/min — ABNORMAL LOW (ref >60)
Glucose, Bld: 123 mg/dL — ABNORMAL HIGH (ref 70–99)
Potassium: 3.1 mmol/L — ABNORMAL LOW (ref 3.5–5.1)
Sodium: 137 mmol/L (ref 135–145)
Total Bilirubin: 1 mg/dL (ref 0.3–1.2)
Total Protein: 8.1 g/dL (ref 6.5–8.1)

## 2018-12-03 LAB — CBC
HCT: 45.8 % (ref 36.0–46.0)
Hemoglobin: 15.4 g/dL — ABNORMAL HIGH (ref 12.0–15.0)
MCH: 31.5 pg (ref 26.0–34.0)
MCHC: 33.6 g/dL (ref 30.0–36.0)
MCV: 93.7 fL (ref 80.0–100.0)
Platelets: 282 10*3/uL (ref 150–400)
RBC: 4.89 MIL/uL (ref 3.87–5.11)
RDW: 12.3 % (ref 11.5–15.5)
WBC: 22.2 10*3/uL — ABNORMAL HIGH (ref 4.0–10.5)
nRBC: 0 % (ref 0.0–0.2)

## 2018-12-03 LAB — LIPASE, BLOOD: Lipase: 38 U/L (ref 11–51)

## 2018-12-03 MED ORDER — POTASSIUM CHLORIDE CRYS ER 20 MEQ PO TBCR
40.0000 meq | EXTENDED_RELEASE_TABLET | Freq: Once | ORAL | Status: AC
  Administered 2018-12-03: 12:00:00 40 meq via ORAL
  Filled 2018-12-03: qty 2

## 2018-12-03 MED ORDER — SODIUM CHLORIDE 0.9 % IV BOLUS
1000.0000 mL | Freq: Once | INTRAVENOUS | Status: AC
  Administered 2018-12-03: 05:00:00 1000 mL via INTRAVENOUS

## 2018-12-03 MED ORDER — ONDANSETRON HCL 4 MG/2ML IJ SOLN
4.0000 mg | Freq: Once | INTRAMUSCULAR | Status: AC
  Administered 2018-12-03: 4 mg via INTRAVENOUS
  Filled 2018-12-03: qty 2

## 2018-12-03 NOTE — ED Provider Notes (Signed)
Care assumed from Dr. Jeris Penta while awaiting results of urinalysis.  Patient had nausea, vomiting, diarrhea but a benign abdominal exam.  Patient has had this before in the past ever since she had gallbladder surgery years ago.  Patient continued to have no further abdominal pain and was able to eat and drink.  Urinalysis shows leukocytes and bacteria but no nitrites.  Given complete lack of urinary symptoms, patient agreed to hold on antibiotic therapy and will instead get a culture to look for infection.  Patient will follow up with her gastroenterologist this week and understands return precautions.  She no other questions or concerns and was discharged in good condition with her family.   Clinical Impression: 1. Nausea vomiting and diarrhea   2. Generalized abdominal pain     Disposition: Discharge  Condition: Good  I have discussed the results, Dx and Tx plan with the pt(& family if present). He/she/they expressed understanding and agree(s) with the plan. Discharge instructions discussed at great length. Strict return precautions discussed and pt &/or family have verbalized understanding of the instructions. No further questions at time of discharge.    New Prescriptions   No medications on file    Follow Up: your gastroenterologist     [place] [address] [id number] [place]    Alexis Donovan, [place] [employer] [address] [place] [address] [phone]          Alexis Donovan, Alexis Allegra, MD 12/03/18 1143

## 2018-12-03 NOTE — ED Triage Notes (Signed)
Pt BIB [facility] c/o abdominal pain, NVD starting 11:30 pm yesterday. Hx Gallbladder removal 4 years ago, since then has had similar episodes. Per EMS, 200 ml Bile-like emesis.   COVID negative 2 weeks ago

## 2018-12-03 NOTE — Discharge Instructions (Signed)
Your work-up today was consistent with some dehydration with your nausea, vomiting, and diarrhea.  As your abdominal exam was reassuring, the previous team agreed with you to hold on further imaging.  Please follow-up with your gastroenterologist for further management.  We sent a urine culture and you will be called if there is evidence of significant infection.  If any symptoms change or worsen, please return to the nearest emergency department.  Please try and stay hydrated.

## 2018-12-03 NOTE — ED Notes (Signed)
Alexis Donovan [phone] waiting outside in car

## 2018-12-03 NOTE — ED Notes (Addendum)
Blood draw attempt unsuccessful. RN Mel Almond informed. Will ask Tyron to draw labs

## 2018-12-03 NOTE — ED Provider Notes (Signed)
[facility] EMERGENCY DEPARTMENT Provider Note  CSN: [id number] Arrival date & time: 12/03/18 0403  Chief Complaint(s) Abdominal Pain  HPI Alexis Donovan is a 78 y.o. female   The history is provided by the patient.  Abdominal Pain Pain location:  Epigastric Pain quality: cramping   Pain severity:  Moderate Onset quality:  Gradual Duration:  6 hours Timing:  Intermittent Progression:  Resolved Chronicity:  Recurrent Relieved by:  Vomiting Worsened by:  Nothing Associated symptoms: diarrhea, nausea and vomiting    Patient reports similar episodes in the past that began after she had her cholecystectomy 4 years ago.   Denies any known sick contacts.  No suspicious food intake.  No recent travel.  Past Medical History Past Medical History:  Diagnosis Date  . 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 Hypertension   . Hypothyroidism   . Thyroid disease    Patient Active Problem List   Diagnosis Date Noted  . 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 Anniversary reaction 08/30/2014  . Biliary colic 123XX123  . 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 ANXIETY DEPRESSION 11/04/2009  . INTRINSIC ASTHMA, UNSPECIFIED 12/25/2007  . HYPOTHYROIDISM 12/22/2007  . HYPERTENSION 12/22/2007  . ALLERGIC RHINITIS 12/22/2007   Home Medication(s) Prior to Admission medications   Medication Sig Start Date End Date Taking? Authorizing Provider  budesonide-formoterol (SYMBICORT) 160-4.5 MCG/ACT inhaler Inhale 2 puffs into the lungs 2 (two) times daily. 08/10/13   Noralee Space, MD  citalopram (CELEXA) 20 MG tablet Take 20 mg by mouth daily. 1/2 tablet    [provider]  clonazePAM (KLONOPIN) 0.5 MG tablet Take 0.5 mg by mouth daily as needed for anxiety.     [provider]  Cyanocobalamin (VITAMIN B12 PO) Take 5,000 Units by mouth daily.    [provider]  estradiol (ESTRACE) 0.5 MG  tablet Take 0.5 mg by mouth daily.    [provider]  levothyroxine (SYNTHROID, LEVOTHROID) 112 MCG tablet Take 112 mcg by mouth daily.    [provider]  Red Yeast Rice Extract (RED YEAST RICE PO) Take by mouth daily.    [provider]  valsartan-hydrochlorothiazide (DIOVAN-HCT) 160-25 MG per tablet Take 1 tablet by mouth daily.    [provider]  zolpidem (AMBIEN) 10 MG tablet Take 2.5 mg by mouth every other day.     [provider]                                                                                                                                    Past Surgical History Past Surgical History:  Procedure Laterality Date  . ABDOMINAL HYSTERECTOMY    . CHOLECYSTECTOMY  03/20/2014   laproscopic  . CHOLECYSTECTOMY N/A 03/20/2014   Procedure: LAPAROSCOPIC CHOLECYSTECTOMY;  Surgeon: Doreen Salvage, MD;  Location: [place];  Service: General;  Laterality: N/A;   Family History Family  History  Problem Relation Age of Onset  . Heart disease Mother   . Heart disease Father   . Heart disease Maternal Grandfather   . Heart disease Paternal Grandfather   . Cancer Daughter        breast  . Breast cancer Daughter 35    Social History Social History   Tobacco Use  . Smoking status: Never Smoker  . Smokeless tobacco: Never Used  Substance Use Topics  . Alcohol use: Yes    Alcohol/week: 7.0 standard drinks    Types: 7 Glasses of wine per week  . Drug use: No   Allergies Patient has no known allergies.  Review of Systems Review of Systems  Gastrointestinal: Positive for abdominal pain, diarrhea, nausea and vomiting.   All other systems are reviewed and are negative for acute change except as noted in the HPI  Physical Exam Vital Signs  I have reviewed the triage vital signs BP 127/68   Pulse 68   Temp 97.9 F (36.6 C) (Oral)   Resp 15   Ht 5\' 7"  (1.702 m)   Wt 72.6 kg   SpO2 99%   BMI 25.06 kg/m   Physical Exam Vitals  signs reviewed.  Constitutional:      General: She is not in acute distress.    Appearance: She is well-developed. She is not diaphoretic.  HENT:     Head: Normocephalic and atraumatic.     Right Ear: External ear normal.     Left Ear: External ear normal.     Nose: Nose normal.  Eyes:     General: No scleral icterus.    Conjunctiva/sclera: Conjunctivae normal.  Neck:     Musculoskeletal: Normal range of motion.     Trachea: Phonation normal.  Cardiovascular:     Rate and Rhythm: Normal rate and regular rhythm.  Pulmonary:     Effort: Pulmonary effort is normal. No respiratory distress.     Breath sounds: No stridor.  Abdominal:     General: There is no distension.     Tenderness: There is no abdominal tenderness. There is no guarding or rebound.  Musculoskeletal: Normal range of motion.  Neurological:     Mental Status: She is alert and oriented to person, place, and time.  Psychiatric:        Behavior: Behavior normal.     ED Results and Treatments Labs (all labs ordered are listed, but only abnormal results are displayed) Labs Reviewed  COMPREHENSIVE METABOLIC PANEL - Abnormal; Notable for the following components:      Result Value   Potassium 3.1 (*)    CO2 20 (*)    Glucose, Bld 123 (*)    GFR calc non Af Amer 57 (*)    Anion gap 16 (*)    All other components within normal limits  CBC - Abnormal; Notable for the following components:   WBC 22.2 (*)    Hemoglobin 15.4 (*)    All other components within normal limits  LIPASE, BLOOD  URINALYSIS, ROUTINE W REFLEX MICROSCOPIC  EKG  EKG Interpretation  Date/Time:    Ventricular Rate:    PR Interval:    QRS Duration:   QT Interval:    QTC Calculation:   R Axis:     Text Interpretation:        Radiology No results found.  Pertinent labs & imaging results that were available during my  care of the patient were reviewed by me and considered in my medical decision making (see chart for details).  Medications Ordered in ED Medications  potassium chloride SA (KLOR-CON) CR tablet 40 mEq (has no administration in time range)  sodium chloride 0.9 % bolus 1,000 mL (1,000 mLs Intravenous New Bag/Given 12/03/18 0509)  ondansetron (ZOFRAN) injection 4 mg (4 mg Intravenous Given 12/03/18 0509)                                                                                                                                    Procedures Procedures  (including critical care time)  Medical Decision Making / ED Course I have reviewed the nursing notes for this encounter and the patient's prior records (if available in EHR or on provided paperwork).   Alexis Donovan was evaluated in Emergency Department on 12/03/2018 for the symptoms described in the history of present illness. She was evaluated in the context of the global COVID-19 pandemic, which necessitated consideration that the patient might be at risk for infection with the SARS-CoV-2 virus that causes COVID-19. Institutional protocols and algorithms that pertain to the evaluation of patients at risk for COVID-19 are in a state of rapid change based on information released by regulatory bodies including the CDC and federal and state organizations. These policies and algorithms were followed during the patient's care in the ED.  78 y.o. female presents with vomiting, diarrhea. No possible suspicious food intake.  Rest of history as above.  Patient appears well, not in distress, and with no signs of toxicity or dehydration. Abdomen benign.  Rest of the exam as above  Doubt appendicitis, diverticulitis, severe colitis, dysentery.    Labs notable for leukocytosis likely secondary to demargination from emesis.  CBC also notable for hemoconcentration.  Metabolic panel with mild hypokalemia, otherwise no significant electrolyte derangements or  renal sufficiency.  No evidence of biliary obstruction or pancreatitis.  Plan for IVF and PO challenge.  Patient care turned over to Dr Sherry Ruffing. Patient case and results discussed in detail; please see their note for further ED managment.        This chart was dictated using voice recognition software.  Despite best efforts to proofread,  errors can occur which can change the documentation meaning.   Fatima Blank, MD 12/03/18 [phone]

## 2018-12-04 LAB — URINE CULTURE: Culture: <10000 — AB

## 2018-12-05 ENCOUNTER — Ambulatory Visit
Admission: RE | Admit: 2018-12-05 | Discharge: 2018-12-05 | Disposition: A | Discharge status: Home / Self Care | Payer: Medicare HMO | Source: Ambulatory Visit | Attending: Internal Medicine | Admitting: Internal Medicine

## 2018-12-05 ENCOUNTER — Other Ambulatory Visit: Payer: Self-pay

## 2018-12-05 DIAGNOSIS — Z1231 Encounter for screening mammogram for malignant neoplasm of breast: Secondary | ICD-10-CM

## 2018-12-07 DIAGNOSIS — K529 Noninfective gastroenteritis and colitis, unspecified: Secondary | ICD-10-CM | POA: Diagnosis not present

## 2019-01-08 DIAGNOSIS — Z03818 Encounter for observation for suspected exposure to other biological agents ruled out: Secondary | ICD-10-CM | POA: Diagnosis not present

## 2019-01-30 DIAGNOSIS — E039 Hypothyroidism, unspecified: Secondary | ICD-10-CM | POA: Diagnosis not present

## 2019-01-30 DIAGNOSIS — J4520 Mild intermittent asthma, uncomplicated: Secondary | ICD-10-CM | POA: Diagnosis not present

## 2019-01-30 DIAGNOSIS — Z23 Encounter for immunization: Secondary | ICD-10-CM | POA: Diagnosis not present

## 2019-01-30 DIAGNOSIS — Z0000 Encounter for general adult medical examination without abnormal findings: Secondary | ICD-10-CM | POA: Diagnosis not present

## 2019-01-30 DIAGNOSIS — R69 Illness, unspecified: Secondary | ICD-10-CM | POA: Diagnosis not present

## 2019-01-30 DIAGNOSIS — Z1389 Encounter for screening for other disorder: Secondary | ICD-10-CM | POA: Diagnosis not present

## 2019-01-30 DIAGNOSIS — G4700 Insomnia, unspecified: Secondary | ICD-10-CM | POA: Diagnosis not present

## 2019-01-30 DIAGNOSIS — J309 Allergic rhinitis, unspecified: Secondary | ICD-10-CM | POA: Diagnosis not present

## 2019-01-30 DIAGNOSIS — I700 Atherosclerosis of aorta: Secondary | ICD-10-CM | POA: Diagnosis not present

## 2019-01-30 DIAGNOSIS — I10 Essential (primary) hypertension: Secondary | ICD-10-CM | POA: Diagnosis not present

## 2019-01-30 DIAGNOSIS — E7800 Pure hypercholesterolemia, unspecified: Secondary | ICD-10-CM | POA: Diagnosis not present

## 2019-01-30 DIAGNOSIS — E559 Vitamin D deficiency, unspecified: Secondary | ICD-10-CM | POA: Diagnosis not present

## 2019-01-30 DIAGNOSIS — Z79899 Other long term (current) drug therapy: Secondary | ICD-10-CM | POA: Diagnosis not present

## 2019-02-08 DIAGNOSIS — R69 Illness, unspecified: Secondary | ICD-10-CM | POA: Diagnosis not present

## 2019-02-09 ENCOUNTER — Ambulatory Visit: Payer: Medicare Other | Attending: Internal Medicine

## 2019-02-09 DIAGNOSIS — Z23 Encounter for immunization: Secondary | ICD-10-CM

## 2019-02-09 NOTE — Progress Notes (Signed)
   Covid-19 Vaccination Clinic  Name:  Alexis Donovan    MRN: [id number] DOB: 04-19-1940  02/09/2019  [name] was observed post Covid-19 immunization for 15 minutes without incidence. She was provided with Vaccine Information Sheet and instruction to access the V-Safe system.   [name] was instructed to call 911 with any severe reactions post vaccine: Marland Kitchen Difficulty breathing  . Swelling of your face and throat  . A fast heartbeat  . A bad rash all over your body  . Dizziness and weakness    Immunizations Administered    Name Date Dose VIS Date Route   Pfizer COVID-19 Vaccine 02/09/2019  8:40 AM 0.3 mL 01/05/2019 Intramuscular   Manufacturer: [employer], [employer]   Lot: [id number]   [place]: [id number]

## 2019-02-28 ENCOUNTER — Ambulatory Visit: Payer: Medicare HMO | Attending: Internal Medicine

## 2019-02-28 DIAGNOSIS — Z23 Encounter for immunization: Secondary | ICD-10-CM | POA: Insufficient documentation

## 2019-02-28 NOTE — Progress Notes (Signed)
   Covid-19 Vaccination Clinic  Name:  Alexis Donovan    MRN: [id number] DOB: [birth year]  02/28/2019  [name] was observed post Covid-19 immunization for 15 minutes without incidence. She was provided with Vaccine Information Sheet and instruction to access the V-Safe system.   [name] was instructed to call 911 with any severe reactions post vaccine: Marland Kitchen Difficulty breathing  . Swelling of your face and throat  . A fast heartbeat  . A bad rash all over your body  . Dizziness and weakness    Immunizations Administered    Name Date Dose VIS Date Route   Pfizer COVID-19 Vaccine 02/28/2019  9:39 AM 0.3 mL 01/05/2019 Intramuscular   Manufacturer: [place]   Lot: [id number]   [place]: [id number]

## 2019-03-14 DIAGNOSIS — J4520 Mild intermittent asthma, uncomplicated: Secondary | ICD-10-CM | POA: Diagnosis not present

## 2019-03-14 DIAGNOSIS — R490 Dysphonia: Secondary | ICD-10-CM | POA: Diagnosis not present

## 2019-03-14 DIAGNOSIS — I10 Essential (primary) hypertension: Secondary | ICD-10-CM | POA: Diagnosis not present

## 2019-03-14 DIAGNOSIS — M8580 Other specified disorders of bone density and structure, unspecified site: Secondary | ICD-10-CM | POA: Diagnosis not present

## 2019-03-22 ENCOUNTER — Other Ambulatory Visit: Payer: Self-pay | Admitting: Internal Medicine

## 2019-03-22 DIAGNOSIS — M8580 Other specified disorders of bone density and structure, unspecified site: Secondary | ICD-10-CM

## 2019-03-22 DIAGNOSIS — Z1231 Encounter for screening mammogram for malignant neoplasm of breast: Secondary | ICD-10-CM

## 2019-04-03 DIAGNOSIS — R490 Dysphonia: Secondary | ICD-10-CM | POA: Diagnosis not present

## 2019-04-18 DIAGNOSIS — L820 Inflamed seborrheic keratosis: Secondary | ICD-10-CM | POA: Diagnosis not present

## 2019-04-18 DIAGNOSIS — D485 Neoplasm of uncertain behavior of skin: Secondary | ICD-10-CM | POA: Diagnosis not present

## 2019-05-24 DIAGNOSIS — D2271 Melanocytic nevi of right lower limb, including hip: Secondary | ICD-10-CM | POA: Diagnosis not present

## 2019-05-24 DIAGNOSIS — L821 Other seborrheic keratosis: Secondary | ICD-10-CM | POA: Diagnosis not present

## 2019-05-24 DIAGNOSIS — D2272 Melanocytic nevi of left lower limb, including hip: Secondary | ICD-10-CM | POA: Diagnosis not present

## 2019-05-24 DIAGNOSIS — L820 Inflamed seborrheic keratosis: Secondary | ICD-10-CM | POA: Diagnosis not present

## 2019-05-24 DIAGNOSIS — L570 Actinic keratosis: Secondary | ICD-10-CM | POA: Diagnosis not present

## 2019-05-24 DIAGNOSIS — L720 Epidermal cyst: Secondary | ICD-10-CM | POA: Diagnosis not present

## 2019-06-14 ENCOUNTER — Encounter: Payer: Self-pay | Admitting: Interventional Cardiology

## 2019-06-14 ENCOUNTER — Ambulatory Visit: Payer: Medicare HMO | Admitting: Interventional Cardiology

## 2019-06-14 ENCOUNTER — Other Ambulatory Visit: Payer: Self-pay

## 2019-06-14 VITALS — BP 122/78 | HR 80 | Ht 67.0 in | Wt 159.0 lb

## 2019-06-14 DIAGNOSIS — R55 Syncope and collapse: Secondary | ICD-10-CM | POA: Diagnosis not present

## 2019-06-14 DIAGNOSIS — Z8249 Family history of ischemic heart disease and other diseases of the circulatory system: Secondary | ICD-10-CM | POA: Diagnosis not present

## 2019-06-14 DIAGNOSIS — R5382 Chronic fatigue, unspecified: Secondary | ICD-10-CM | POA: Diagnosis not present

## 2019-06-14 DIAGNOSIS — I4510 Unspecified right bundle-branch block: Secondary | ICD-10-CM

## 2019-06-14 NOTE — Progress Notes (Signed)
Cardiology Office Note   Date:  06/14/2019   ID:  Alexis Donovan, DOB 04-04-1940, MRN [id number]  PCP:  Josetta Huddle, MD    No chief complaint on file.  Family h/o CAD  Wt Readings from Last 3 Encounters:  06/14/19 159 lb (72.1 kg)  12/03/18 160 lb (72.6 kg)  11/18/17 161 lb (73 kg)       History of Present Illness: Alexis Donovan is a 79 y.o. female  wanted to be seen by a cardiologist so she was established in the system.   Her parents had heart problems and both died at 15.  No AAA on prior screening in 2019.  Had some fatigue, but was thought to be from deconditioning in 2019. Plan was : "We discussed RF modification.  LDL target 130.  Increased exercise and healthy diet. No angina on medical therapy. If she has problems tolerating regular exercise, she will let us know.  "  Since the last visit, she has done well for the most part.  She did have asever GI bug from "food poisoning" several months.  She had a syncopal episode a few days ago on 5/16 evening.  SHe was nauseated and sat on the toilet and then woke up next to the toilet on the floor.  She does use Questran for gall bladder issues.  She drinks a lot of water.  Doubts dehydration.  During the pandemic, exercise classes were cancelled.  Less exercise since COVID.   Denies : Chest pain. Dizziness. Leg edema. Nitroglycerin use. Orthopnea. Palpitations.  Paroxysmal nocturnal dyspnea. Shortness of breath.      Past Medical History:  Diagnosis Date  . 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 Hypertension   . Hypothyroidism   . Thyroid disease     Past Surgical History:  Procedure Laterality Date  . ABDOMINAL HYSTERECTOMY    . CHOLECYSTECTOMY  03/20/2014   laproscopic  . CHOLECYSTECTOMY N/A 03/20/2014   Procedure: LAPAROSCOPIC CHOLECYSTECTOMY;  Surgeon: Doreen Salvage, MD;  Location: [place];  Service: General;  Laterality: N/A;     Current Outpatient Medications  Medication Sig Dispense Refill  .  budesonide-formoterol (SYMBICORT) 160-4.5 MCG/ACT inhaler Inhale 2 puffs into the lungs 2 (two) times daily. 1 Inhaler 6  . cholecalciferol (VITAMIN D3) 25 MCG (1000 UT) tablet Take 1,000 Units by mouth daily.    . cholestyramine (QUESTRAN) 4 GM/DOSE powder Take 4 g by mouth daily.    . citalopram (CELEXA) 20 MG tablet Take 20 mg by mouth daily. 1/2 tablet    . clonazePAM (KLONOPIN) 0.5 MG tablet Take 0.5 mg by mouth daily.     . Cyanocobalamin (VITAMIN B12 PO) Take 5,000 Units by mouth daily.    Marland Kitchen estradiol (ESTRACE) 0.5 MG tablet Take 0.5 mg by mouth daily.    Marland Kitchen levothyroxine (SYNTHROID, LEVOTHROID) 112 MCG tablet Take 112 mcg by mouth daily.    . Red Yeast Rice Extract (RED YEAST RICE PO) Take by mouth daily.    . valsartan-hydrochlorothiazide (DIOVAN-HCT) 160-25 MG per tablet Take 1 tablet by mouth daily.    Marland Kitchen zolpidem (AMBIEN) 10 MG tablet Take 5 mg by mouth daily.      No current facility-administered medications for this visit.    Allergies:   Patient has no known allergies.    Social History:  The patient  reports that she has never smoked. She has never used smokeless tobacco. She reports current alcohol use of about 7.0 standard drinks of alcohol  per week. She reports that she does not use drugs.   Family History:  The patient's family history includes Breast cancer (age of onset: 68) in her daughter; Cancer in her daughter; Heart disease in her father, maternal grandfather, mother, and paternal grandfather.    ROS:  Please see the history of present illness.   Otherwise, review of systems are positive for nausea preceding syncope.   All other systems are reviewed and negative.    PHYSICAL EXAM: VS:  BP 122/78   Pulse 80   Ht 5\' 7"  (1.702 m)   Wt 159 lb (72.1 kg)   SpO2 94%   BMI 24.90 kg/m  , BMI Body mass index is 24.9 kg/m. GEN: Well nourished, well developed, in no acute distress  HEENT: normal  Neck: no JVD, carotid bruits, or masses Cardiac: RRR; no murmurs,  rubs, or gallops,no edema ;  Respiratory:  clear to auscultation bilaterally, normal work of breathing GI: soft, nontender, nondistended, + BS MS: no deformity or atrophy  Skin: warm and dry, no rash Neuro:  Strength and sensation are intact Psych: euthymic mood, full affect   EKG:   The ekg ordered today demonstrates NSR, RBBB, no ST changes   Recent Labs: 12/03/2018: ALT 16; BUN 18; Creatinine, Ser 0.96; Hemoglobin 15.4; Platelets 282; Potassium 3.1; Sodium 137   Lipid Panel No results found for: CHOL, TRIG, HDL, CHOLHDL, VLDL, LDLCALC, LDLDIRECT   Other studies Reviewed: Additional studies/ records that were reviewed today with results demonstrating: ER records reviewed.   ASSESSMENT AND PLAN:  1. Family h/o CAD: No angina.  Healthy lifestyle.  2. RBBB: Chronic.  3. Fatigue: Less exercise since COVID.  Increase exercise slowly. 4. Syncope: Likely vagal episode.  Consider monitor if she has any more lightheadedness or dizziness.  Stay hydrated.  She will also f/u with Dr. Inda Merlin.  No cardiac etiology for syncope noted by exam.     Current medicines are reviewed at length with the patient today.  The patient concerns regarding her medicines were addressed.  The following changes have been made:  No change  Labs/ tests ordered today include:  No orders of the defined types were placed in this encounter.   Recommend 150 minutes/week of aerobic exercise Low fat, low carb, high fiber diet recommended  Disposition:   FU in 1 year   Signed, Larae Grooms, MD  06/14/2019 3:06 PM    [facility] [address], [place], [place]  [address] Phone: [phone]; Fax: [phone]

## 2019-06-14 NOTE — Patient Instructions (Signed)
Medication Instructions:  Your physician recommends that you continue on your current medications as directed. Please refer to the Current Medication list given to you today.  *If you need a refill on your cardiac medications before your next appointment, please call your pharmacy*   Lab Work: None ordered  If you have labs (blood work) drawn today and your tests are completely normal, you will receive your results only by: Marland Kitchen MyChart Message (if you have MyChart) OR . A paper copy in the mail If you have any lab test that is abnormal or we need to change your treatment, we will call you to review the results.   Testing/Procedures: None ordered   Follow-Up: At [facility], you and your health needs are our priority.  As part of our continuing mission to provide you with exceptional heart care, we have created designated Provider Care Teams.  These Care Teams include your primary Cardiologist (physician) and Advanced Practice Providers (APPs -  Physician Assistants and Nurse Practitioners) who all work together to provide you with the care you need, when you need it.  We recommend signing up for the patient portal called "MyChart".  Sign up information is provided on this After Visit Summary.  MyChart is used to connect with patients for Virtual Visits (Telemedicine).  Patients are able to view lab/test results, encounter notes, upcoming appointments, etc.  Non-urgent messages can be sent to your provider as well.   To learn more about what you can do with MyChart, go to [email or web address].    Your next appointment:   12 month(s)  The format for your next appointment:   In Person  Provider:   You may see Larae Grooms, MD or one of the following Advanced Practice Providers on your designated Care Team:    Melina Copa, PA-C  Ermalinda Barrios, PA-C    Other Instructions Stay hydrated

## 2019-07-23 ENCOUNTER — Other Ambulatory Visit: Payer: Self-pay | Admitting: Internal Medicine

## 2019-07-23 ENCOUNTER — Ambulatory Visit
Admission: RE | Admit: 2019-07-23 | Discharge: 2019-07-23 | Disposition: A | Discharge status: Home / Self Care | Payer: Medicare HMO | Source: Ambulatory Visit | Attending: Internal Medicine | Admitting: Internal Medicine

## 2019-07-23 DIAGNOSIS — M545 Low back pain, unspecified: Secondary | ICD-10-CM

## 2019-08-09 DIAGNOSIS — R69 Illness, unspecified: Secondary | ICD-10-CM | POA: Diagnosis not present

## 2019-08-14 DIAGNOSIS — M545 Low back pain: Secondary | ICD-10-CM | POA: Diagnosis not present

## 2019-08-14 DIAGNOSIS — M6281 Muscle weakness (generalized): Secondary | ICD-10-CM | POA: Diagnosis not present

## 2019-08-14 DIAGNOSIS — R2689 Other abnormalities of gait and mobility: Secondary | ICD-10-CM | POA: Diagnosis not present

## 2019-08-21 DIAGNOSIS — R2689 Other abnormalities of gait and mobility: Secondary | ICD-10-CM | POA: Diagnosis not present

## 2019-08-21 DIAGNOSIS — M545 Low back pain: Secondary | ICD-10-CM | POA: Diagnosis not present

## 2019-08-21 DIAGNOSIS — M6281 Muscle weakness (generalized): Secondary | ICD-10-CM | POA: Diagnosis not present

## 2019-08-24 DIAGNOSIS — R2689 Other abnormalities of gait and mobility: Secondary | ICD-10-CM | POA: Diagnosis not present

## 2019-08-24 DIAGNOSIS — M6281 Muscle weakness (generalized): Secondary | ICD-10-CM | POA: Diagnosis not present

## 2019-08-24 DIAGNOSIS — M545 Low back pain: Secondary | ICD-10-CM | POA: Diagnosis not present

## 2019-10-17 DIAGNOSIS — Z0000 Encounter for general adult medical examination without abnormal findings: Secondary | ICD-10-CM | POA: Diagnosis not present

## 2019-10-17 DIAGNOSIS — N952 Postmenopausal atrophic vaginitis: Secondary | ICD-10-CM | POA: Diagnosis not present

## 2019-10-17 DIAGNOSIS — N898 Other specified noninflammatory disorders of vagina: Secondary | ICD-10-CM | POA: Diagnosis not present

## 2019-10-18 DIAGNOSIS — R69 Illness, unspecified: Secondary | ICD-10-CM | POA: Diagnosis not present

## 2019-12-06 ENCOUNTER — Other Ambulatory Visit: Payer: Self-pay

## 2019-12-06 ENCOUNTER — Ambulatory Visit: Payer: Medicare HMO

## 2019-12-06 ENCOUNTER — Ambulatory Visit
Admission: RE | Admit: 2019-12-06 | Discharge: 2019-12-06 | Disposition: A | Discharge status: Home / Self Care | Payer: Medicare HMO | Source: Ambulatory Visit | Attending: Internal Medicine | Admitting: Internal Medicine

## 2019-12-06 DIAGNOSIS — Z780 Asymptomatic menopausal state: Secondary | ICD-10-CM | POA: Diagnosis not present

## 2019-12-06 DIAGNOSIS — M8580 Other specified disorders of bone density and structure, unspecified site: Secondary | ICD-10-CM

## 2020-01-31 ENCOUNTER — Ambulatory Visit: Payer: Medicare HMO

## 2020-02-04 DIAGNOSIS — I700 Atherosclerosis of aorta: Secondary | ICD-10-CM | POA: Diagnosis not present

## 2020-02-04 DIAGNOSIS — J4520 Mild intermittent asthma, uncomplicated: Secondary | ICD-10-CM | POA: Diagnosis not present

## 2020-02-04 DIAGNOSIS — I10 Essential (primary) hypertension: Secondary | ICD-10-CM | POA: Diagnosis not present

## 2020-02-04 DIAGNOSIS — E559 Vitamin D deficiency, unspecified: Secondary | ICD-10-CM | POA: Diagnosis not present

## 2020-02-04 DIAGNOSIS — Z1389 Encounter for screening for other disorder: Secondary | ICD-10-CM | POA: Diagnosis not present

## 2020-02-04 DIAGNOSIS — J309 Allergic rhinitis, unspecified: Secondary | ICD-10-CM | POA: Diagnosis not present

## 2020-02-04 DIAGNOSIS — E2839 Other primary ovarian failure: Secondary | ICD-10-CM | POA: Diagnosis not present

## 2020-02-04 DIAGNOSIS — Z79899 Other long term (current) drug therapy: Secondary | ICD-10-CM | POA: Diagnosis not present

## 2020-02-04 DIAGNOSIS — E538 Deficiency of other specified B group vitamins: Secondary | ICD-10-CM | POA: Diagnosis not present

## 2020-02-04 DIAGNOSIS — E7800 Pure hypercholesterolemia, unspecified: Secondary | ICD-10-CM | POA: Diagnosis not present

## 2020-02-04 DIAGNOSIS — R69 Illness, unspecified: Secondary | ICD-10-CM | POA: Diagnosis not present

## 2020-02-04 DIAGNOSIS — G4700 Insomnia, unspecified: Secondary | ICD-10-CM | POA: Diagnosis not present

## 2020-02-04 DIAGNOSIS — E039 Hypothyroidism, unspecified: Secondary | ICD-10-CM | POA: Diagnosis not present

## 2020-02-04 DIAGNOSIS — Z0000 Encounter for general adult medical examination without abnormal findings: Secondary | ICD-10-CM | POA: Diagnosis not present

## 2020-02-06 DIAGNOSIS — R059 Cough, unspecified: Secondary | ICD-10-CM | POA: Diagnosis not present

## 2020-02-06 DIAGNOSIS — Z20822 Contact with and (suspected) exposure to covid-19: Secondary | ICD-10-CM | POA: Diagnosis not present

## 2020-02-08 ENCOUNTER — Telehealth: Payer: Self-pay | Admitting: Internal Medicine

## 2020-02-08 ENCOUNTER — Encounter: Payer: Self-pay | Admitting: Internal Medicine

## 2020-02-08 NOTE — Telephone Encounter (Signed)
Patient's gastroenterologist at [facility] retired and husband request that I accept her as a patient when she needs GI care and I am happy to do so.

## 2020-02-14 DIAGNOSIS — H524 Presbyopia: Secondary | ICD-10-CM | POA: Diagnosis not present

## 2020-02-14 DIAGNOSIS — H2513 Age-related nuclear cataract, bilateral: Secondary | ICD-10-CM | POA: Diagnosis not present

## 2020-02-20 DIAGNOSIS — Z0100 Encounter for examination of eyes and vision without abnormal findings: Secondary | ICD-10-CM | POA: Diagnosis not present

## 2020-03-18 ENCOUNTER — Other Ambulatory Visit: Payer: Self-pay

## 2020-03-18 ENCOUNTER — Ambulatory Visit
Admission: RE | Admit: 2020-03-18 | Discharge: 2020-03-18 | Disposition: A | Discharge status: Home / Self Care | Payer: Medicare HMO | Source: Ambulatory Visit | Attending: Internal Medicine | Admitting: Internal Medicine

## 2020-03-18 DIAGNOSIS — Z1231 Encounter for screening mammogram for malignant neoplasm of breast: Secondary | ICD-10-CM

## 2020-04-03 ENCOUNTER — Encounter: Payer: Self-pay | Admitting: Internal Medicine

## 2020-04-03 ENCOUNTER — Ambulatory Visit: Payer: Medicare HMO | Admitting: Internal Medicine

## 2020-04-03 ENCOUNTER — Other Ambulatory Visit: Payer: Self-pay

## 2020-04-03 VITALS — BP 124/72 | HR 66 | Ht 67.0 in | Wt 156.0 lb

## 2020-04-03 DIAGNOSIS — K30 Functional dyspepsia: Secondary | ICD-10-CM | POA: Diagnosis not present

## 2020-04-03 DIAGNOSIS — K9089 Other intestinal malabsorption: Secondary | ICD-10-CM

## 2020-04-03 MED ORDER — CHOLESTYRAMINE 4 GM/DOSE PO POWD: ORAL | 11 refills | Status: AC

## 2020-04-03 NOTE — Patient Instructions (Signed)
We will obtain your records from [facility] for review.  We have sent medication to your pharmacy for you to pick up at your convenience.   I appreciate the opportunity to care for you. Silvano Rusk, MD, [facility]

## 2020-04-03 NOTE — Progress Notes (Signed)
Alexis Donovan 80 y.o. 1940/12/05 [id number]  Assessment & Plan:   Encounter Diagnoses  Name Primary?  . Bile salt-induced diarrhea Yes  . Functional dyspepsia     Continue treatment with cholestyramine.  Medication refilled today. Her other symptoms sound like dyspepsia and ginger seems to treat that well so I would continue.  I can see her back annually and/or as needed.  We will obtain records from [facility] gastroenterology for completeness.  I appreciate the opportunity to care for this patient. CC: Josetta Huddle, MD    Subjective:   Chief Complaint: Establish care for bile salt diarrhea  HPI Alexis Donovan is a 80 year old white woman previously under the care of Dr. Acquanetta Sit, he retired last year and she seeks to establish care with another gastroenterologist.  She has a history of bile salt diarrhea managed by almost a scoopful of cholestyramine daily.  She also has issues with intermittent abdominal upset or mild discomfort and nausea treated with ginger ale or crystallized ginger with success.  Her quality of life is good.  She had a colonoscopy at some point in the last couple of years and she may have had some polyps.  She indicates she is not inclined to repeat a colonoscopy and does not think that was recommended.  GI review of systems is otherwise negative. No Known Allergies Current Meds  Medication Sig  . budesonide-formoterol (SYMBICORT) 160-4.5 MCG/ACT inhaler Inhale 2 puffs into the lungs 2 (two) times daily.  . cholecalciferol (VITAMIN D3) 25 MCG (1000 UT) tablet Take 1,000 Units by mouth daily.  . cholestyramine (QUESTRAN) 4 GM/DOSE powder 1 scoop daily  . citalopram (CELEXA) 20 MG tablet Take 20 mg by mouth daily. 1/2 tablet  . clonazePAM (KLONOPIN) 0.5 MG tablet Take 0.5 mg by mouth daily.  . Cyanocobalamin (VITAMIN B12 PO) Take 5,000 Units by mouth daily.  Marland Kitchen estradiol (ESTRACE) 0.5 MG tablet Take 0.5 mg by mouth daily.  Marland Kitchen levothyroxine (SYNTHROID,  LEVOTHROID) 112 MCG tablet Take 112 mcg by mouth daily.  . valsartan-hydrochlorothiazide (DIOVAN-HCT) 160-25 MG per tablet Take 1 tablet by mouth daily.  Marland Kitchen zolpidem (AMBIEN) 10 MG tablet Take 5 mg by mouth daily.   . [DISCONTINUED] cholestyramine (QUESTRAN) 4 GM/DOSE powder Take 4 g by mouth daily.   Past Medical History:  Diagnosis Date  . 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 Bile salt-induced diarrhea   . Hypertension   . Hypothyroidism   . Thyroid disease    Past Surgical History:  Procedure Laterality Date  . ABDOMINAL HYSTERECTOMY    . CHOLECYSTECTOMY N/A 03/20/2014   Procedure: LAPAROSCOPIC CHOLECYSTECTOMY;  Surgeon: Doreen Salvage, MD;  Location: [facility];  Service: General;  Laterality: N/A;  . COLONOSCOPY     Social History   Social History Narrative   She is married to [employer] an active lifestyle   2 daughters   Retired Data processing manager work   1 alcoholic beverage daily   1 caffeinated beverage daily   Never smoker   family history includes Aneurysm in her father; Breast cancer (age of onset: 66) in her daughter; Heart disease in her father, maternal grandfather, mother, and paternal grandfather.   Review of Systems Otherwise negative or as per HPI  Objective:   Physical Exam @BP  124/72   Pulse 66   Ht 5\' 7"  (1.702 m)   Wt 156 lb (70.8 kg)   BMI 24.43 kg/m @  General:  NAD Eyes:   anicteric Lungs:  clear Heart::  S1S2 no rubs, murmurs or gallops Abdomen:  soft and nontender, BS+ Ext:   no edema, cyanosis or clubbing

## 2020-04-16 DIAGNOSIS — G4700 Insomnia, unspecified: Secondary | ICD-10-CM | POA: Diagnosis not present

## 2020-05-13 DIAGNOSIS — J019 Acute sinusitis, unspecified: Secondary | ICD-10-CM | POA: Diagnosis not present

## 2020-06-25 DIAGNOSIS — D2271 Melanocytic nevi of right lower limb, including hip: Secondary | ICD-10-CM | POA: Diagnosis not present

## 2020-06-25 DIAGNOSIS — L820 Inflamed seborrheic keratosis: Secondary | ICD-10-CM | POA: Diagnosis not present

## 2020-06-25 DIAGNOSIS — L821 Other seborrheic keratosis: Secondary | ICD-10-CM | POA: Diagnosis not present

## 2020-06-25 DIAGNOSIS — D2262 Melanocytic nevi of left upper limb, including shoulder: Secondary | ICD-10-CM | POA: Diagnosis not present

## 2020-06-25 DIAGNOSIS — D2261 Melanocytic nevi of right upper limb, including shoulder: Secondary | ICD-10-CM | POA: Diagnosis not present

## 2020-06-25 DIAGNOSIS — D2272 Melanocytic nevi of left lower limb, including hip: Secondary | ICD-10-CM | POA: Diagnosis not present

## 2020-06-25 DIAGNOSIS — D225 Melanocytic nevi of trunk: Secondary | ICD-10-CM | POA: Diagnosis not present

## 2020-06-25 DIAGNOSIS — L720 Epidermal cyst: Secondary | ICD-10-CM | POA: Diagnosis not present

## 2020-07-03 NOTE — Progress Notes (Signed)
Cardiology Office Note   Date:  07/04/2020   ID:  Alexis Donovan, DOB 30-Nov-1940, MRN [id number]  PCP:  Josetta Huddle, MD    No chief complaint on file.  Family history of early coronary artery disease  Wt Readings from Last 3 Encounters:  07/04/20 157 lb 6.4 oz (71.4 kg)  04/03/20 156 lb (70.8 kg)  06/14/19 159 lb (72.1 kg)       History of Present Illness: Alexis Donovan is a 80 y.o. female    wanted to be seen by a cardiologist so she was established in the system.   Her parents had heart problems and both died at 27.   No AAA on prior screening in 2019.   Had some fatigue, but was thought to be from deconditioning in 2019. Plan was : "We discussed RF modification.  LDL target 130.  Increased exercise and healthy diet. No angina on medical therapy. If she has problems tolerating regular exercise, she will let us know.  "   She had a syncopal episode on 06/10/19 evening.  SHe was nauseated and sat on the toilet and then woke up next to the toilet on the floor.  She does use Questran for gall bladder issues.  She drinks a lot of water.  Doubts dehydration.  We thought it was likely a vasovagal episode.   In the last year, she has done well.    Denies : Chest pain. Dizziness. Leg edema. Nitroglycerin use. Orthopnea. Palpitations. Paroxysmal nocturnal dyspnea. Shortness of breath. Syncope.    Just feels that energy level is low.  No regular walking.  Does yoga.  Still taking questran.     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 1 Inhaler 6   cholecalciferol (VITAMIN D3) 25 MCG (1000 UT) tablet Take 1,000 Units by mouth daily.     cholestyramine (QUESTRAN) 4 GM/DOSE powder 1 scoop daily 348 g 11   citalopram (CELEXA) 20 MG tablet Take 20 mg by mouth daily. 1/2 tablet     clonazePAM (KLONOPIN) 0.5 MG tablet Take 0.5 mg by mouth daily.     Cyanocobalamin (VITAMIN B12 PO) Take 5,000 Units by mouth daily.     estradiol (ESTRACE) 0.5 MG tablet Take 0.5 mg by mouth daily.     levothyroxine (SYNTHROID, LEVOTHROID) 112 MCG tablet Take 112 mcg by mouth daily.     valsartan-hydrochlorothiazide (DIOVAN-HCT) 160-25 MG per tablet Take 1 tablet by mouth daily.     zolpidem (AMBIEN) 10 MG tablet Take 5 mg by mouth daily.      No current facility-administered medications for this visit.    Allergies:   Ace inhibitors and Levofloxacin    Social History:  The patient  reports that she has never smoked. She has never used smokeless tobacco. She reports current alcohol use of about 7.0 standard drinks of alcohol per week. She reports that she does not  use drugs.   Family History:  The patient's family history includes Aneurysm in her father; Breast cancer (age of onset: 64) in her daughter; Heart disease in her father, maternal grandfather, mother, and paternal grandfather.    ROS:  Please see the history of present illness.   Otherwise, review of systems are positive for mild fatigue.   All other systems are reviewed and negative.    PHYSICAL EXAM: VS:  BP 140/70   Pulse 66   Ht 5\' 7"  (1.702 m)   Wt 157 lb 6.4 oz (71.4 kg)   SpO2 95%   BMI 24.65 kg/m  , BMI Body mass index is 24.65 kg/m. GEN: Well nourished, well developed, in no acute distress HEENT: normal Neck: no JVD, carotid bruits, or masses Cardiac: RRR; no murmurs, rubs, or gallops,no edema  Respiratory:  clear to auscultation bilaterally, normal  work of breathing GI: soft, nontender, nondistended, + BS MS: no deformity or atrophy Skin: warm and dry, no rash Neuro:  Strength and sensation are intact Psych: euthymic mood, full affect   EKG:   The ekg ordered today demonstrates NSR, RBBB. No ST changes   Recent Labs: No results found for requested labs within last 8760 hours.   Lipid Panel No results found for: CHOL, TRIG, HDL, CHOLHDL, VLDL, LDLCALC, LDLDIRECT   Other studies Reviewed: Additional studies/ records that were reviewed today with results demonstrating: LDL 117 in 2022 Jan, HDL 62, TG 238.   ASSESSMENT AND PLAN:  Family h/o CAD: Plan for calcium scoring CT scan to further risk stratify.  If she has a high calcium score, would consider lipid-lowering therapy. Whole food plant based diet for TG elevation.  Increase fiber.  RBBB: Chronic.  Fatigue: Noted after her exercise dropped off with COVID restrictions.  She still does not walk regularly.  She does do yoga.  I encouraged her to try to do more cardio exercise to keep her heart rate elevated for 30 minutes at a time.  We discussed the "talk test."    Current medicines are reviewed at length with the patient today.  The patient concerns regarding her medicines were addressed.  The following changes have been made:  No change  Labs/ tests ordered today include:  No orders of the defined types were placed in this encounter.   Recommend 150 minutes/week of aerobic exercise Low fat, low carb, high fiber diet recommended  Disposition:   FU in 1 year   Signed, Larae Grooms, MD  07/04/2020 4:49 PM    [facility] [place], [place], [place]  [address] Phone: [phone]; Fax: [phone]

## 2020-07-04 ENCOUNTER — Ambulatory Visit: Payer: Medicare HMO | Admitting: Interventional Cardiology

## 2020-07-04 ENCOUNTER — Encounter: Payer: Self-pay | Admitting: Interventional Cardiology

## 2020-07-04 ENCOUNTER — Other Ambulatory Visit: Payer: Self-pay

## 2020-07-04 VITALS — BP 140/70 | HR 66 | Ht 67.0 in | Wt 157.4 lb

## 2020-07-04 DIAGNOSIS — I4510 Unspecified right bundle-branch block: Secondary | ICD-10-CM | POA: Diagnosis not present

## 2020-07-04 DIAGNOSIS — R5382 Chronic fatigue, unspecified: Secondary | ICD-10-CM

## 2020-07-04 DIAGNOSIS — Z8249 Family history of ischemic heart disease and other diseases of the circulatory system: Secondary | ICD-10-CM

## 2020-07-04 NOTE — Patient Instructions (Signed)
Medication Instructions:  Your physician recommends that you continue on your current medications as directed. Please refer to the Current Medication list given to you today.  *If you need a refill on your cardiac medications before your next appointment, please call your pharmacy*   Lab Work: none If you have labs (blood work) drawn today and your tests are completely normal, you will receive your results only by: [place] (if you have MyChart) OR A paper copy in the mail If you have any lab test that is abnormal or we need to change your treatment, we will call you to review the results.   Testing/Procedures: Dr Irish Lack recommends you have a Calcium Score CT Scan   Follow-Up: At [facility], you and your health needs are our priority.  As part of our continuing mission to provide you with exceptional heart care, we have created designated Provider Care Teams.  These Care Teams include your primary Cardiologist (physician) and Advanced Practice Providers (APPs -  Physician Assistants and Nurse Practitioners) who all work together to provide you with the care you need, when you need it.  We recommend signing up for the patient portal called "MyChart".  Sign up information is provided on this After Visit Summary.  MyChart is used to connect with patients for Virtual Visits (Telemedicine).  Patients are able to view lab/test results, encounter notes, upcoming appointments, etc.  Non-urgent messages can be sent to your provider as well.   To learn more about what you can do with MyChart, go to [email or web address].    Your next appointment:   12 month(s)  The format for your next appointment:   In Person  Provider:   You may see Larae Grooms, MD or one of the following Advanced Practice Providers on your designated Care Team:   Melina Copa, PA-C Ermalinda Barrios, PA-C   Other Instructions

## 2020-07-15 DIAGNOSIS — J208 Acute bronchitis due to other specified organisms: Secondary | ICD-10-CM | POA: Diagnosis not present

## 2020-08-11 ENCOUNTER — Other Ambulatory Visit: Payer: Self-pay | Admitting: Internal Medicine

## 2020-08-11 ENCOUNTER — Ambulatory Visit
Admission: RE | Admit: 2020-08-11 | Discharge: 2020-08-11 | Disposition: A | Discharge status: Home / Self Care | Payer: Medicare HMO | Source: Ambulatory Visit | Attending: Internal Medicine | Admitting: Internal Medicine

## 2020-08-11 DIAGNOSIS — J342 Deviated nasal septum: Secondary | ICD-10-CM

## 2020-08-11 DIAGNOSIS — R059 Cough, unspecified: Secondary | ICD-10-CM | POA: Diagnosis not present

## 2020-08-11 DIAGNOSIS — J929 Pleural plaque without asbestos: Secondary | ICD-10-CM | POA: Diagnosis not present

## 2020-08-11 DIAGNOSIS — R5383 Other fatigue: Secondary | ICD-10-CM | POA: Diagnosis not present

## 2020-08-11 DIAGNOSIS — R490 Dysphonia: Secondary | ICD-10-CM | POA: Diagnosis not present

## 2020-08-18 ENCOUNTER — Other Ambulatory Visit: Payer: Self-pay

## 2020-08-18 ENCOUNTER — Ambulatory Visit (INDEPENDENT_AMBULATORY_CARE_PROVIDER_SITE_OTHER)
Admission: RE | Admit: 2020-08-18 | Discharge: 2020-08-18 | Disposition: A | Discharge status: Home / Self Care | Payer: Self-pay | Source: Ambulatory Visit | Attending: Interventional Cardiology | Admitting: Interventional Cardiology

## 2020-08-18 DIAGNOSIS — I10 Essential (primary) hypertension: Secondary | ICD-10-CM

## 2020-08-19 ENCOUNTER — Telehealth: Payer: Self-pay | Admitting: Nurse Practitioner

## 2020-08-19 MED ORDER — ROSUVASTATIN CALCIUM 10 MG PO TABS: 10.0000 mg | ORAL_TABLET | Freq: Every day | ORAL | 3 refills | Status: DC

## 2020-08-19 NOTE — Telephone Encounter (Signed)
-----   Message from Jettie Booze, MD sent at 08/19/2020 11:19 AM EDT ----- Moderately elevated calcium score.  70th percentile for her age.  Would start rosuvastatin 10 mg daily.

## 2020-08-19 NOTE — Telephone Encounter (Signed)
Reviewed CT results and plan of care with patient who verbalized understanding. Questions were answered to her satisfaction. She states she would like to review the results with Dr. Inda Merlin, PCP, prior to starting rosuvastatin. I advised that I have forwarded the test results to him and asked her to call back to report questions or concerns regarding starting the rosuvastatin. She verbalized agreement and thanked me for the call.

## 2020-10-29 DIAGNOSIS — I2510 Atherosclerotic heart disease of native coronary artery without angina pectoris: Secondary | ICD-10-CM | POA: Diagnosis not present

## 2021-03-23 ENCOUNTER — Emergency Department (HOSPITAL_COMMUNITY)
Admission: EM | Admit: 2021-03-23 | Discharge: 2021-03-23 | Disposition: A | Discharge status: Home / Self Care | Payer: Medicare HMO | Attending: Emergency Medicine | Admitting: Emergency Medicine

## 2021-03-23 ENCOUNTER — Other Ambulatory Visit: Payer: Self-pay

## 2021-03-23 ENCOUNTER — Emergency Department (HOSPITAL_COMMUNITY): Payer: Medicare HMO

## 2021-03-23 ENCOUNTER — Encounter (HOSPITAL_COMMUNITY): Payer: Self-pay | Admitting: *Deleted

## 2021-03-23 DIAGNOSIS — R112 Nausea with vomiting, unspecified: Secondary | ICD-10-CM | POA: Insufficient documentation

## 2021-03-23 DIAGNOSIS — I10 Essential (primary) hypertension: Secondary | ICD-10-CM | POA: Insufficient documentation

## 2021-03-23 DIAGNOSIS — R197 Diarrhea, unspecified: Secondary | ICD-10-CM | POA: Diagnosis not present

## 2021-03-23 DIAGNOSIS — D72829 Elevated white blood cell count, unspecified: Secondary | ICD-10-CM | POA: Insufficient documentation

## 2021-03-23 DIAGNOSIS — Z79899 Other long term (current) drug therapy: Secondary | ICD-10-CM | POA: Diagnosis not present

## 2021-03-23 DIAGNOSIS — R109 Unspecified abdominal pain: Secondary | ICD-10-CM | POA: Insufficient documentation

## 2021-03-23 DIAGNOSIS — Z743 Need for continuous supervision: Secondary | ICD-10-CM | POA: Diagnosis not present

## 2021-03-23 DIAGNOSIS — R1110 Vomiting, unspecified: Secondary | ICD-10-CM | POA: Diagnosis not present

## 2021-03-23 DIAGNOSIS — E860 Dehydration: Secondary | ICD-10-CM | POA: Insufficient documentation

## 2021-03-23 DIAGNOSIS — R1084 Generalized abdominal pain: Secondary | ICD-10-CM | POA: Diagnosis not present

## 2021-03-23 DIAGNOSIS — R1111 Vomiting without nausea: Secondary | ICD-10-CM | POA: Diagnosis not present

## 2021-03-23 DIAGNOSIS — I700 Atherosclerosis of aorta: Secondary | ICD-10-CM | POA: Diagnosis not present

## 2021-03-23 LAB — CBC
HCT: 45.7 % (ref 36.0–46.0)
Hemoglobin: 15.8 g/dL — ABNORMAL HIGH (ref 12.0–15.0)
MCH: 31.8 pg (ref 26.0–34.0)
MCHC: 34.6 g/dL (ref 30.0–36.0)
MCV: 92 fL (ref 80.0–100.0)
Platelets: 271 10*3/uL (ref 150–400)
RBC: 4.97 MIL/uL (ref 3.87–5.11)
RDW: 12.8 % (ref 11.5–15.5)
WBC: 19.3 10*3/uL — ABNORMAL HIGH (ref 4.0–10.5)
nRBC: 0 % (ref 0.0–0.2)

## 2021-03-23 LAB — COMPREHENSIVE METABOLIC PANEL
ALT: 14 U/L (ref 0–44)
AST: 20 U/L (ref 15–41)
Albumin: 4.6 g/dL (ref 3.5–5.0)
Alkaline Phosphatase: 47 U/L (ref 38–126)
Anion gap: 10 (ref 5–15)
BUN: 19 mg/dL (ref 8–23)
CO2: 26 mmol/L (ref 22–32)
Calcium: 10.2 mg/dL (ref 8.9–10.3)
Chloride: 99 mmol/L (ref 98–111)
Creatinine, Ser: 1.04 mg/dL — ABNORMAL HIGH (ref 0.44–1.00)
GFR, Estimated: 55 mL/min — ABNORMAL LOW (ref >60)
Glucose, Bld: 138 mg/dL — ABNORMAL HIGH (ref 70–99)
Potassium: 3.5 mmol/L (ref 3.5–5.1)
Sodium: 135 mmol/L (ref 135–145)
Total Bilirubin: 0.8 mg/dL (ref 0.3–1.2)
Total Protein: 8.3 g/dL — ABNORMAL HIGH (ref 6.5–8.1)

## 2021-03-23 LAB — LIPASE, BLOOD: Lipase: 58 U/L — ABNORMAL HIGH (ref 11–51)

## 2021-03-23 MED ORDER — IOHEXOL 300 MG/ML  SOLN
100.0000 mL | Freq: Once | INTRAMUSCULAR | Status: AC | PRN
  Administered 2021-03-23: 100 mL via INTRAVENOUS

## 2021-03-23 MED ORDER — ONDANSETRON HCL 4 MG/2ML IJ SOLN
4.0000 mg | Freq: Once | INTRAMUSCULAR | Status: AC
  Administered 2021-03-23: 4 mg via INTRAVENOUS
  Filled 2021-03-23: qty 2

## 2021-03-23 MED ORDER — LACTATED RINGERS IV BOLUS
1000.0000 mL | Freq: Once | INTRAVENOUS | Status: AC
  Administered 2021-03-23: 1000 mL via INTRAVENOUS

## 2021-03-23 MED ORDER — ONDANSETRON HCL 4 MG PO TABS: 4.0000 mg | ORAL_TABLET | Freq: Three times a day (TID) | ORAL | 0 refills | Status: DC | PRN

## 2021-03-23 NOTE — Discharge Instructions (Signed)
You had a CT scan performed of you abdomen today.  The CT scan showed atherosclerosis that will need to be followed up by your family doctor.

## 2021-03-23 NOTE — ED Notes (Signed)
ED Provider at bedside. 

## 2021-03-23 NOTE — ED Provider Notes (Signed)
[facility] DEPT Provider Note   CSN: [id number] Arrival date & time: 03/23/21  0100     History  Chief Complaint  Patient presents with   Emesis    Alexis Donovan is a 81 y.o. female.  The history is provided by the patient, the EMS personnel and medical records.  Emesis Alexis Donovan is a 81 y.o. female who presents to the Emergency Department complaining of V/D. She presents to the ED by EMS for evaluation of vomiting and diarrhea that started at 11pm. She started feeling poorly around 10pm.  No known sick contacts.  No definite bad food exposures.  Lives with husband.  No fever, abdominal pain.  Feels like prior episode of food poisoning.    S/p cholecystectomy.   Has a hx/o HTN, thyroid disease, HPL.   No dysuria.    Home Medications Prior to Admission medications   Medication Sig Start Date End Date Taking? Authorizing Provider  ondansetron (ZOFRAN) 4 MG tablet Take 1 tablet (4 mg total) by mouth every 8 (eight) hours as needed for nausea or vomiting. 03/23/21  Yes Quintella Reichert, MD  budesonide-formoterol [facility]) 160-4.5 MCG/ACT inhaler Inhale 2 puffs into the lungs 2 (two) times daily. 08/10/13   Noralee Space, MD  cholecalciferol (VITAMIN D3) 25 MCG (1000 UT) tablet Take 1,000 Units by mouth daily.    [provider]  cholestyramine Lucrezia Starch) 4 GM/DOSE powder 1 scoop daily 04/03/20   Gatha Mayer, MD  citalopram (CELEXA) 20 MG tablet Take 20 mg by mouth daily. 1/2 tablet    [provider]  clonazePAM (KLONOPIN) 0.5 MG tablet Take 0.5 mg by mouth daily.    [provider]  Cyanocobalamin (VITAMIN B12 PO) Take 5,000 Units by mouth daily.    [provider]  estradiol (ESTRACE) 0.5 MG tablet Take 0.5 mg by mouth daily.    [provider]  levothyroxine (SYNTHROID, LEVOTHROID) 112 MCG tablet Take 112 mcg by mouth daily.    [provider]  rosuvastatin (CRESTOR) 10 MG tablet  Take 1 tablet (10 mg total) by mouth daily. 08/19/20   Jettie Booze, MD  valsartan-hydrochlorothiazide (DIOVAN-HCT) 160-25 MG per tablet Take 1 tablet by mouth daily.    [provider]  zolpidem (AMBIEN) 10 MG tablet Take 5 mg by mouth daily.     [provider]      Allergies    Ace inhibitors and Levofloxacin    Review of Systems   Review of Systems  Gastrointestinal:  Positive for vomiting.  All other systems reviewed and are negative.  Physical Exam Updated Vital Signs BP (!) 145/84    Pulse 92    Temp (!) 96.5 F (35.8 C) (Axillary)    Resp (!) 24    Ht 5\' 7"  (1.702 m)    Wt 71.7 kg    SpO2 99%    BMI 24.75 kg/m  Physical Exam Vitals and nursing note reviewed.  Constitutional:      Appearance: She is well-developed.  HENT:     Head: Normocephalic and atraumatic.  Cardiovascular:     Rate and Rhythm: Normal rate and regular rhythm.  Pulmonary:     Effort: Pulmonary effort is normal. No respiratory distress.  Abdominal:     Palpations: Abdomen is soft.     Tenderness: There is no guarding or rebound.     Comments: Mild abdominal tenderness  Musculoskeletal:        General: No tenderness.  Skin:    General: Skin is warm and dry.  Neurological:     Mental Status: She is alert and oriented to person, place, and time.  Psychiatric:        Behavior: Behavior normal.    ED Results / Procedures / Treatments   Labs (all labs ordered are listed, but only abnormal results are displayed) Labs Reviewed  LIPASE, BLOOD - Abnormal; Notable for the following components:      Result Value   Lipase 58 (*)    All other components within normal limits  COMPREHENSIVE METABOLIC PANEL - Abnormal; Notable for the following components:   Glucose, Bld 138 (*)    Creatinine, Ser 1.04 (*)    Total Protein 8.3 (*)    GFR, Estimated 55 (*)    All other components within normal limits  CBC - Abnormal; Notable for the following components:   WBC 19.3 (*)     Hemoglobin 15.8 (*)    All other components within normal limits    EKG None  Radiology CT Abdomen Pelvis W Contrast  Result Date: 03/23/2021 CLINICAL DATA:  Nausea and vomiting with acute, nonlocalized abdominal pain. EXAM: CT ABDOMEN AND PELVIS WITH CONTRAST TECHNIQUE: Multidetector CT imaging of the abdomen and pelvis was performed using the standard protocol following bolus administration of intravenous contrast. RADIATION DOSE REDUCTION: This exam was performed according to the departmental dose-optimization program which includes automated exposure control, adjustment of the mA and/or kV according to patient size and/or use of iterative reconstruction technique. CONTRAST:  149mL OMNIPAQUE IOHEXOL 300 MG/ML  SOLN COMPARISON:  None. FINDINGS: Lower chest:  No contributory findings. Hepatobiliary: No focal liver abnormality.Cholecystectomy. No bile duct dilatation. Pancreas: Unremarkable. Spleen: Unremarkable. Adrenals/Urinary Tract: Negative adrenals. No hydronephrosis or stone. Tiny right lower pole renal cystic density. Unremarkable bladder. Stomach/Bowel: No obstruction. No appendicitis or other bowel wall thickening. Few left colonic diverticula, typical for age. Vascular/Lymphatic: No acute vascular abnormality. Atheromatous calcification of the aorta and iliacs. 60% narrowing at the right common iliac artery origin as measured on coronal reformats. No mass or adenopathy. Reproductive:No pathologic findings. Other: No ascites or pneumoperitoneum. Musculoskeletal: No acute abnormalities. IMPRESSION: 1. No acute finding.  No bowel obstruction or visible inflammation. 2. Aortic Atherosclerosis (ICD10-I70.0). 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 Electronically Signed   By: Jorje Guild M.D.   On: 03/23/2021 04:27    Procedures Procedures    Medications Ordered in ED Medications  lactated ringers bolus 1,000 mL (1,000 mLs Intravenous New Bag/Given  03/23/21 0143)  ondansetron (ZOFRAN) injection 4 mg (4 mg Intravenous Given 03/23/21 0142)  iohexol (OMNIPAQUE) 300 MG/ML solution 100 mL (100 mLs Intravenous Contrast Given 03/23/21 0355)    ED Course/ Medical Decision Making/ A&P                           Medical Decision Making Amount and/or Complexity of Data Reviewed Labs: ordered. Radiology: ordered.  Risk Prescription drug management.   Patient here for evaluation of vomiting, diarrhea that started at 11 PM.  She did feel near syncopal after multiple episodes of vomiting and diarrhea.  On evaluation she is nontoxic-appearing, appears mildly dehydrated.  She was treated with antiemetic as well as IV fluid hydration.  CBC with leukocytosis, similar when compared to priors.  She did have some mild abdominal tenderness on repeat assessment and a CT abdomen pelvis was obtained.  CT abdomen pelvis is negative for acute  abnormality.  It does demonstrate atherosclerosis-discussed this finding with the patient.  She has no urinary symptoms, do not feel urinalysis is necessary at this point in time.  After treatment with antiemetics and IV fluids she is feeling partially improved and able to tolerate oral fluids.  Plan to discharge home with as needed antiemetic at home with outpatient follow-up as well as return precautions.  Current clinical picture is not consistent with sepsis, SBO.        Final Clinical Impression(s) / ED Diagnoses Final diagnoses:  Nausea vomiting and diarrhea    Rx / DC Orders ED Discharge Orders          Ordered    ondansetron (ZOFRAN) 4 MG tablet  Every 8 hours PRN        03/23/21 0457              Quintella Reichert, MD 03/23/21 [phone]

## 2021-03-23 NOTE — ED Triage Notes (Signed)
Pt arrives via [facility] from home, per report the pt has had  nausea, vomiting, diarrhea since last night. Unable to keep down fluids. 118/82, hr 74, 100% ra, cbg 155.

## 2021-03-23 NOTE — ED Notes (Signed)
Patient transported to CT 

## 2021-05-05 ENCOUNTER — Other Ambulatory Visit: Payer: Self-pay | Admitting: Internal Medicine

## 2021-05-05 DIAGNOSIS — Z1231 Encounter for screening mammogram for malignant neoplasm of breast: Secondary | ICD-10-CM

## 2021-05-12 ENCOUNTER — Ambulatory Visit
Admission: RE | Admit: 2021-05-12 | Discharge: 2021-05-12 | Disposition: A | Discharge status: Home / Self Care | Payer: Medicare HMO | Source: Ambulatory Visit | Attending: Internal Medicine | Admitting: Internal Medicine

## 2021-05-12 DIAGNOSIS — Z1231 Encounter for screening mammogram for malignant neoplasm of breast: Secondary | ICD-10-CM

## 2021-05-21 DIAGNOSIS — R059 Cough, unspecified: Secondary | ICD-10-CM | POA: Diagnosis not present

## 2021-05-21 DIAGNOSIS — J4521 Mild intermittent asthma with (acute) exacerbation: Secondary | ICD-10-CM | POA: Diagnosis not present

## 2021-05-28 ENCOUNTER — Other Ambulatory Visit: Payer: Self-pay | Admitting: Internal Medicine

## 2021-05-28 ENCOUNTER — Ambulatory Visit
Admission: RE | Admit: 2021-05-28 | Discharge: 2021-05-28 | Disposition: A | Discharge status: Home / Self Care | Payer: Medicare HMO | Source: Ambulatory Visit | Attending: Internal Medicine | Admitting: Internal Medicine

## 2021-05-28 DIAGNOSIS — R5383 Other fatigue: Secondary | ICD-10-CM | POA: Diagnosis not present

## 2021-05-28 DIAGNOSIS — R051 Acute cough: Secondary | ICD-10-CM

## 2021-05-28 DIAGNOSIS — R059 Cough, unspecified: Secondary | ICD-10-CM | POA: Diagnosis not present

## 2021-05-28 DIAGNOSIS — Z03818 Encounter for observation for suspected exposure to other biological agents ruled out: Secondary | ICD-10-CM | POA: Diagnosis not present

## 2021-06-24 DIAGNOSIS — L570 Actinic keratosis: Secondary | ICD-10-CM | POA: Diagnosis not present

## 2021-06-24 DIAGNOSIS — J309 Allergic rhinitis, unspecified: Secondary | ICD-10-CM | POA: Diagnosis not present

## 2021-06-24 DIAGNOSIS — E039 Hypothyroidism, unspecified: Secondary | ICD-10-CM | POA: Diagnosis not present

## 2021-06-24 DIAGNOSIS — G4700 Insomnia, unspecified: Secondary | ICD-10-CM | POA: Diagnosis not present

## 2021-06-24 DIAGNOSIS — D2262 Melanocytic nevi of left upper limb, including shoulder: Secondary | ICD-10-CM | POA: Diagnosis not present

## 2021-06-24 DIAGNOSIS — D2272 Melanocytic nevi of left lower limb, including hip: Secondary | ICD-10-CM | POA: Diagnosis not present

## 2021-06-24 DIAGNOSIS — F419 Anxiety disorder, unspecified: Secondary | ICD-10-CM | POA: Diagnosis not present

## 2021-06-24 DIAGNOSIS — Z0000 Encounter for general adult medical examination without abnormal findings: Secondary | ICD-10-CM | POA: Diagnosis not present

## 2021-06-24 DIAGNOSIS — D2271 Melanocytic nevi of right lower limb, including hip: Secondary | ICD-10-CM | POA: Diagnosis not present

## 2021-06-24 DIAGNOSIS — E559 Vitamin D deficiency, unspecified: Secondary | ICD-10-CM | POA: Diagnosis not present

## 2021-06-24 DIAGNOSIS — E7800 Pure hypercholesterolemia, unspecified: Secondary | ICD-10-CM | POA: Diagnosis not present

## 2021-06-24 DIAGNOSIS — I700 Atherosclerosis of aorta: Secondary | ICD-10-CM | POA: Diagnosis not present

## 2021-06-24 DIAGNOSIS — J4520 Mild intermittent asthma, uncomplicated: Secondary | ICD-10-CM | POA: Diagnosis not present

## 2021-06-24 DIAGNOSIS — D2261 Melanocytic nevi of right upper limb, including shoulder: Secondary | ICD-10-CM | POA: Diagnosis not present

## 2021-06-24 DIAGNOSIS — Z1331 Encounter for screening for depression: Secondary | ICD-10-CM | POA: Diagnosis not present

## 2021-06-24 DIAGNOSIS — L821 Other seborrheic keratosis: Secondary | ICD-10-CM | POA: Diagnosis not present

## 2021-06-24 DIAGNOSIS — Z79899 Other long term (current) drug therapy: Secondary | ICD-10-CM | POA: Diagnosis not present

## 2021-06-24 DIAGNOSIS — I10 Essential (primary) hypertension: Secondary | ICD-10-CM | POA: Diagnosis not present

## 2021-06-24 DIAGNOSIS — E2839 Other primary ovarian failure: Secondary | ICD-10-CM | POA: Diagnosis not present

## 2021-06-24 DIAGNOSIS — R69 Illness, unspecified: Secondary | ICD-10-CM | POA: Diagnosis not present

## 2021-06-24 DIAGNOSIS — L820 Inflamed seborrheic keratosis: Secondary | ICD-10-CM | POA: Diagnosis not present

## 2021-07-28 ENCOUNTER — Other Ambulatory Visit: Payer: Self-pay | Admitting: Interventional Cardiology

## 2021-08-21 ENCOUNTER — Other Ambulatory Visit: Payer: Self-pay | Admitting: Interventional Cardiology

## 2021-09-08 ENCOUNTER — Other Ambulatory Visit: Payer: Self-pay | Admitting: Interventional Cardiology

## 2021-09-17 ENCOUNTER — Other Ambulatory Visit: Payer: Self-pay | Admitting: Interventional Cardiology

## 2021-09-18 ENCOUNTER — Telehealth: Payer: Self-pay | Admitting: Interventional Cardiology

## 2021-09-18 MED ORDER — ROSUVASTATIN CALCIUM 10 MG PO TABS: 10.0000 mg | ORAL_TABLET | Freq: Every day | ORAL | 1 refills | Status: DC

## 2021-09-18 NOTE — Telephone Encounter (Signed)
*  STAT* If patient is at the pharmacy, call can be transferred to refill team.   1. Which medications need to be refilled? (please list name of each medication and dose if known) rosuvastatin (CRESTOR) 10 MG tablet  2. Which pharmacy/location (including street and city if local pharmacy) is medication to be sent to? [facility], [place] - [address]  3. Do they need a 30 day or 90 day supply? [place]

## 2021-09-18 NOTE — Telephone Encounter (Signed)
Pt scheduled to see Dr. Irish Lack, October, 2023.  Sent in #30 rosuvastatin with 1 refill to [facility]

## 2021-10-16 ENCOUNTER — Other Ambulatory Visit: Payer: Self-pay | Admitting: Interventional Cardiology

## 2021-11-05 NOTE — Progress Notes (Deleted)
Cardiology Office Note   Date:  11/05/2021   ID:  Alexis Donovan, DOB 1940-09-21, MRN [id number]  PCP:  Alexis Huddle, MD    No chief complaint on file.  FH early CAD  Wt Readings from Last 3 Encounters:  03/23/21 158 lb (71.7 kg)  07/04/20 157 lb 6.4 oz (71.4 kg)  04/03/20 156 lb (70.8 kg)       History of Present Illness: Alexis Donovan is a 81 y.o. female  wanted to be seen by a cardiologist so she was established in the system several years ago.   Her parents had heart problems and both died at 57.   No AAA on prior screening in 2019.   Had some fatigue, but was thought to be from deconditioning in 2019. Plan was : "We discussed RF modification.  LDL target 130.  Increased exercise and healthy diet. No angina on medical therapy. If she has problems tolerating regular exercise, she will let us know.  "   She had a syncopal episode on 06/10/19 evening.  SHe was nauseated and sat on the toilet and then woke up next to the toilet on the floor.  She does use Questran for gall bladder issues.  She drinks a lot of water.  Doubts dehydration.  We thought it was likely a vasovagal episode.      In 2022: "Just feels that energy level is low.  No regular walking.  Does yoga.  Still taking questran. 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 1 Inhaler 6   cholecalciferol (VITAMIN D3) 25 MCG (1000 UT) tablet Take 1,000  Units by mouth daily.     cholestyramine (QUESTRAN) 4 GM/DOSE powder 1 scoop daily 348 g 11   citalopram (CELEXA) 20 MG tablet Take 20 mg by mouth daily. 1/2 tablet     clonazePAM (KLONOPIN) 0.5 MG tablet Take 0.5 mg by mouth daily.     Cyanocobalamin (VITAMIN B12 PO) Take 5,000 Units by mouth daily.     estradiol (ESTRACE) 0.5 MG tablet Take 0.5 mg by mouth daily.     levothyroxine (SYNTHROID, LEVOTHROID) 112 MCG tablet Take 112 mcg by mouth daily.     ondansetron (ZOFRAN) 4 MG tablet Take 1 tablet (4 mg total) by mouth every 8 (eight) hours as needed for nausea or vomiting. 12 tablet 0   rosuvastatin (CRESTOR) 10 MG tablet TAKE 1 TABLET BY MOUTH EVERY DAY 30 tablet 1   valsartan-hydrochlorothiazide (DIOVAN-HCT) 160-25 MG per tablet Take 1 tablet by mouth daily.     zolpidem (AMBIEN) 10 MG tablet Take 5 mg by mouth daily.      No current facility-administered medications for this visit.    Allergies:   Ace inhibitors and Levofloxacin    Social History:  The patient  reports that she has never smoked. She has never used smokeless tobacco. She reports current alcohol use  of about 7.0 standard drinks of alcohol per week. She reports that she does not use drugs.   Family History:  The patient's ***family history includes Aneurysm in her father; Breast cancer (age of onset: 70) in her daughter; Heart disease in her father, maternal grandfather, mother, and paternal grandfather.    ROS:  Please see the history of present illness.   Otherwise, review of systems are positive for ***.   All other systems are reviewed and negative.    PHYSICAL EXAM: VS:  There were no vitals taken for this visit. , BMI There is no height or weight on file to calculate BMI. GEN: Well nourished, well developed, in no acute distress HEENT: normal Neck: no JVD, carotid bruits, or masses Cardiac: ***RRR; no murmurs, rubs, or gallops,no edema  Respiratory:  clear to auscultation bilaterally, normal work of  breathing GI: soft, nontender, nondistended, + BS MS: no deformity or atrophy Skin: warm and dry, no rash Neuro:  Strength and sensation are intact Psych: euthymic mood, full affect   EKG:   The ekg ordered today demonstrates ***   Recent Labs: 03/23/2021: ALT 14; BUN 19; Creatinine, Ser 1.04; Hemoglobin 15.8; Platelets 271; Potassium 3.5; Sodium 135   Lipid Panel No results found for: "CHOL", "TRIG", "HDL", "CHOLHDL", "VLDL", "LDLCALC", "LDLDIRECT"   Other studies Reviewed: Additional studies/ records that were reviewed today with results demonstrating: ***.   ASSESSMENT AND PLAN:  FAmily h/o CAD:  RBBB: Fatigue: Increased exercise discussed in the past with the talk test.    Current medicines are reviewed at length with the patient today.  The patient concerns regarding her medicines were addressed.  The following changes have been made:  No change***  Labs/ tests ordered today include: *** No orders of the defined types were placed in this encounter.   Recommend 150 minutes/week of aerobic exercise Low fat, low carb, high fiber diet recommended  Disposition:   FU in ***   Signed, Larae Grooms, MD  11/05/2021 8:02 AM    [facility] [address], [place], [place]  [address] Phone: [phone]; Fax: [phone]

## 2021-11-06 ENCOUNTER — Ambulatory Visit: Payer: Medicare HMO | Admitting: Interventional Cardiology

## 2021-11-06 DIAGNOSIS — Z8249 Family history of ischemic heart disease and other diseases of the circulatory system: Secondary | ICD-10-CM

## 2021-11-06 DIAGNOSIS — R5382 Chronic fatigue, unspecified: Secondary | ICD-10-CM

## 2021-11-06 DIAGNOSIS — I4510 Unspecified right bundle-branch block: Secondary | ICD-10-CM

## 2021-11-09 ENCOUNTER — Other Ambulatory Visit: Payer: Self-pay | Admitting: Interventional Cardiology

## 2021-11-18 ENCOUNTER — Ambulatory Visit: Payer: Medicare HMO | Attending: Interventional Cardiology | Admitting: Physician Assistant

## 2021-11-18 ENCOUNTER — Encounter: Payer: Self-pay | Admitting: Physician Assistant

## 2021-11-18 VITALS — BP 123/62 | HR 64 | Ht 67.0 in | Wt 156.2 lb

## 2021-11-18 DIAGNOSIS — I2510 Atherosclerotic heart disease of native coronary artery without angina pectoris: Secondary | ICD-10-CM | POA: Insufficient documentation

## 2021-11-18 HISTORY — DX: Atherosclerotic heart disease of native coronary artery without angina pectoris: I25.10

## 2021-11-18 NOTE — Patient Instructions (Addendum)
Medication Instructions:  Your physician recommends that you continue on your current medications as directed. Please refer to the Current Medication list given to you today, but, hold the Rosuvastatin for 3 weeks then send Scott a mychart message   *If you need a refill on your cardiac medications before your next appointment, please call your pharmacy*   Lab Work: None ordered  If you have labs (blood work) drawn today and your tests are completely normal, you will receive your results only by: [place] (if you have MyChart) OR A paper copy in the mail If you have any lab test that is abnormal or we need to change your treatment, we will call you to review the results.   Testing/Procedures: None ordered   Follow-Up: At [facility], you and your health needs are our priority.  As part of our continuing mission to provide you with exceptional heart care, we have created designated Provider Care Teams.  These Care Teams include your primary Cardiologist (physician) and Advanced Practice Providers (APPs -  Physician Assistants and Nurse Practitioners) who all work together to provide you with the care you need, when you need it.  We recommend signing up for the patient portal called "MyChart".  Sign up information is provided on this After Visit Summary.  MyChart is used to connect with patients for Virtual Visits (Telemedicine).  Patients are able to view lab/test results, encounter notes, upcoming appointments, etc.  Non-urgent messages can be sent to your provider as well.   To learn more about what you can do with MyChart, go to [email or web address].    Your next appointment:   1 year(s)  The format for your next appointment:   In Person  Provider:   Larae Grooms, MD  or Richardson Dopp, PA-C         Other Instructions   Important Information About Sugar

## 2021-11-18 NOTE — Assessment & Plan Note (Addendum)
Calcium score 255 placing her in the 70th percentile.  She is not having anginal symptoms.  She is having some difficulty tolerating statin therapy.  She has had a general feeling of fatigue and achiness since starting the medication.  Labs in May 2023 were unremarkable with normal hemoglobin, normal TSH, normal creatinine.  Her LDL at that time was excellent at 54.  Her HDL was also excellent at 77.   Hold rosuvastatin for 3 weeks.  I have asked her to send a MyChart message at that time.  If she feels much better, we could resume rosuvastatin at 5 mg every Monday, Wednesday, Friday.    She does have aortic atherosclerosis noted on CT scan.  However, she has a history of stomach issues.  She is 81 years old.  I do not think we need to start aspirin at this time.    Follow-up in 1 year.

## 2021-11-18 NOTE — Progress Notes (Signed)
Cardiology Office Note:    Date:  11/18/2021   ID:  Alexis Donovan, DOB 06-18-1940, MRN [id number]  PCP:  Josetta Huddle, MD  [facility] Cardiologist:  Larae Grooms, MD    Referring MD: Josetta Huddle, MD   Chief Complaint:  F/u for coronary Ca2+    Patient Profile: Coronary Ca2+ CAC Score 7/22: 255 (70%) Aortic atherosclerosis  FHx of CAD Right Bundle Branch Block  Hx of syncope in 5/21 - likely vasovagal Hypertension  Hypothyroidism   Prior CV Studies: CT CARDIAC SCORING (SELF PAY ONLY) 08/18/2020 IMPRESSION: Coronary calcium score of 255. This was 70th percentile for age-, race-, and sex-matched controls. Recommend aggressive risk factor modification including LDL goal <70.  IMPRESSION: No acute findings in the imaged extracardiac chest. Aortic Atherosclerosis (ICD10-I70.0).   History of Present Illness:   Alexis Donovan is a 81 y.o. female with the above problem list.  She was last seen by Dr. Irish Lack in June 2022.  She returns for follow-up.  She has generally felt fatigued and achy since starting on rosuvastatin.  She had a recent viral illness that has been slow to resolve.  She has not had chest pain, shortness of breath, syncope, orthopnea, leg edema.        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   cholecalciferol (VITAMIN D3) 25 MCG (1000 UT) tablet Take 1,000 Units by mouth daily.   cholestyramine (QUESTRAN) 4 GM/DOSE powder 1 scoop daily   citalopram (CELEXA) 20 MG tablet Take 20 mg by mouth daily. 1/2 tablet   clonazePAM (KLONOPIN) 0.5 MG  tablet Take 0.5 mg by mouth daily.   Cyanocobalamin (VITAMIN B12 PO) Take 5,000 Units by mouth daily.   estradiol (ESTRACE) 0.5 MG tablet Take 0.5 mg by mouth daily.   levothyroxine (SYNTHROID, LEVOTHROID) 112 MCG tablet Take 112 mcg by mouth daily.   rosuvastatin (CRESTOR) 10 MG tablet TAKE 1 TABLET BY MOUTH EVERY DAY   valsartan-hydrochlorothiazide (DIOVAN-HCT) 160-25 MG per tablet Take 1 tablet by mouth daily.   zolpidem (AMBIEN) 10 MG tablet Take 5 mg by mouth daily.     Allergies:   Ace inhibitors and Levofloxacin   Social History   Tobacco Use   Smoking status: Never   Smokeless tobacco: Never  Substance Use Topics   Alcohol use: Yes    Alcohol/week: 7.0 standard drinks of alcohol    Types: 7 Glasses of wine per week   Drug use: No    Family Hx: The patient's family history includes Aneurysm in her father; Breast cancer (age of onset: 27) in her daughter; Heart disease in her father, maternal grandfather, mother, and paternal grandfather.  Review of Systems  Constitutional: Positive for malaise/fatigue.  Musculoskeletal:  Positive for myalgias.     EKGs/Labs/Other Test Reviewed:    EKG:  EKG is   ordered today.  The ekg ordered today demonstrates NSR, HR 64, first-degree AV block, PR 210, right bundle branch block, QTc 474  Recent Labs: 03/23/2021: ALT 14; BUN 19; Creatinine, Ser 1.04; Hemoglobin 15.8; Platelets 271; Potassium 3.5; Sodium 135  Labs obtained through [facility] - personally reviewed and interpreted: 06/24/2021: Total cholesterol 152, HDL 77, LDL 54, triglycerides 129, Hgb 13.7, creatinine 0.77, K+ 3.7, ALT 15, TSH 0.43   Risk Assessment/Calculations/Metrics:              Physical Exam:    VS:  BP 123/62   Pulse 64   Ht '5\' 7"'$  (1.702 m)   Wt 156 lb 3.2 oz (70.9 kg)   SpO2 97%   BMI 24.46 kg/m     Wt Readings from Last 3 Encounters:  11/18/21 156 lb 3.2 oz (70.9 kg)  03/23/21 158 lb (71.7 kg)  07/04/20 157 lb 6.4 oz (71.4 kg)     Constitutional:      Appearance: Healthy appearance. Not in distress.  Neck:     Vascular: No carotid bruit. JVD normal.  Pulmonary:     Effort: Pulmonary effort is normal.     Breath sounds: No wheezing. No rales.  Cardiovascular:     Normal rate. Regular rhythm. Normal S1. Normal S2.      Murmurs: There is no murmur.  Edema:    Peripheral edema absent.  Abdominal:     Palpations: Abdomen is soft.  Skin:    General: Skin is warm and dry.  Neurological:     Mental Status: Alert and oriented to person, place and time.         ASSESSMENT & PLAN:   Coronary artery calcification seen on CT scan Calcium score 255 placing her in the 70th percentile.  She is not having anginal symptoms.  She is having some difficulty tolerating statin therapy.  She has had a general feeling of fatigue and achiness since starting the medication.  Labs in May 2023 were unremarkable with normal hemoglobin, normal TSH, normal creatinine.  Her LDL at that time was excellent at 54.  Her HDL was also excellent at 77.   Hold rosuvastatin for 3 weeks.  I have asked her to send a MyChart message at that time.  If she feels much better, we could resume rosuvastatin at 5 mg every Monday, Wednesday, Friday.    She does have aortic atherosclerosis noted on CT scan.  However, she has a history of stomach issues.  She is 81 years old.  I do not think we need to start aspirin at this time.    Follow-up in 1 year.            Dispo:  Return in about 1 year (around 11/19/2022) for Routine Follow Up w/ Dr. Irish Lack.   Medication Adjustments/Labs and Tests Ordered: Current medicines are reviewed at length with the patient today.  Concerns regarding medicines are outlined above.  Tests Ordered: Orders Placed This Encounter  Procedures   EKG 12-Lead   Medication Changes: No orders of the defined types were placed in this encounter.  Signed, Richardson Dopp, PA-C  11/18/2021 4:32 PM    [address], [place], [place]  [address] Phone: [phone]; Fax: [phone]

## 2021-11-22 DIAGNOSIS — J069 Acute upper respiratory infection, unspecified: Secondary | ICD-10-CM | POA: Diagnosis not present

## 2021-11-27 DIAGNOSIS — J45909 Unspecified asthma, uncomplicated: Secondary | ICD-10-CM | POA: Diagnosis not present

## 2021-12-09 ENCOUNTER — Other Ambulatory Visit: Payer: Self-pay | Admitting: Interventional Cardiology

## 2021-12-10 ENCOUNTER — Other Ambulatory Visit: Payer: Self-pay | Admitting: Internal Medicine

## 2021-12-10 ENCOUNTER — Ambulatory Visit
Admission: RE | Admit: 2021-12-10 | Discharge: 2021-12-10 | Disposition: A | Discharge status: Home / Self Care | Payer: Medicare HMO | Source: Ambulatory Visit | Attending: Internal Medicine | Admitting: Internal Medicine

## 2021-12-10 DIAGNOSIS — J4521 Mild intermittent asthma with (acute) exacerbation: Secondary | ICD-10-CM

## 2021-12-10 DIAGNOSIS — R918 Other nonspecific abnormal finding of lung field: Secondary | ICD-10-CM | POA: Diagnosis not present

## 2021-12-10 DIAGNOSIS — J984 Other disorders of lung: Secondary | ICD-10-CM | POA: Diagnosis not present

## 2021-12-10 DIAGNOSIS — I771 Stricture of artery: Secondary | ICD-10-CM | POA: Diagnosis not present

## 2022-02-09 ENCOUNTER — Telehealth: Payer: Self-pay | Admitting: Internal Medicine

## 2022-02-09 NOTE — Telephone Encounter (Signed)
Patient made the appointment in November for follow-up, since that time she developed constipation that was fairly severe for her, after 1 dose of ondansetron taken for some nausea.  She did stop her cholestyramine but has recently restarted it as she started to move her bowels again.  She is not significantly distended in the abdomen that it was sore and she does have intermittent nausea but that is not really a new problem.  She is wondering about some sort of nausea medicine that may not constipated her.  I reassured her and explained that I do not have any openings until January 30 and it sounds medically reasonable to wait until that time.  Should she deteriorate she may call us back or go to the emergency room if problems are severe.  She is cautiously restarting cholestyramine and I explained that she should hold that if she develops constipation again.

## 2022-02-09 NOTE — Telephone Encounter (Signed)
PT returning call on update to get a sooner appointment. Please advise.

## 2022-02-09 NOTE — Telephone Encounter (Signed)
Patient called, stated she has ongoing diarrhea since last week, and also constipation. Patient has been feeling nauseas and abdominal discomfort and medication is no longer working. Patient is scheduled with Dr. Carlean Purl on 1/30. However, patient is requesting sooner appointment. Please advise.

## 2022-02-23 ENCOUNTER — Ambulatory Visit: Payer: Medicare HMO | Admitting: Internal Medicine

## 2022-02-23 ENCOUNTER — Encounter: Payer: Self-pay | Admitting: Internal Medicine

## 2022-02-23 VITALS — BP 130/64 | HR 75 | Ht 67.0 in | Wt 156.2 lb

## 2022-02-23 DIAGNOSIS — R1013 Epigastric pain: Secondary | ICD-10-CM | POA: Diagnosis not present

## 2022-02-23 DIAGNOSIS — F439 Reaction to severe stress, unspecified: Secondary | ICD-10-CM

## 2022-02-23 DIAGNOSIS — R112 Nausea with vomiting, unspecified: Secondary | ICD-10-CM | POA: Diagnosis not present

## 2022-02-23 DIAGNOSIS — R69 Illness, unspecified: Secondary | ICD-10-CM | POA: Diagnosis not present

## 2022-02-23 DIAGNOSIS — K9089 Other intestinal malabsorption: Secondary | ICD-10-CM

## 2022-02-23 MED ORDER — PROMETHAZINE HCL 12.5 MG PO TABS: 12.5000 mg | ORAL_TABLET | Freq: Four times a day (QID) | ORAL | 0 refills | Status: AC | PRN

## 2022-02-23 NOTE — Progress Notes (Signed)
Alexis Donovan 82 y.o. Jul 11, 1940 [id number]  Assessment & Plan:   Encounter Diagnoses  Name Primary?   Dyspepsia Yes   Nausea and vomiting, unspecified vomiting type    Bile salt-induced diarrhea    Situational stress    Evaluate symptoms with EGD.  Continue cholestyramine for bile salt induced diarrhea, hold if becomes constipated. Promethazine as needed attacks of nausea that often lead to vomiting and diarrhea.  Given severe constipation after a dose of ondansetron we will not use that though I am not convinced that was the culprit.  Etiology not clear.  Situational stress may be playing a role in all of her symptoms at this point though she had those problems prior to her husband's diagnosis with a brain tumor.  The risks and benefits as well as alternatives of endoscopic procedure(s) have been discussed and reviewed. All questions answered. The patient agrees to proceed.     Meds ordered this encounter  Medications   promethazine (PHENERGAN) 12.5 MG tablet    Sig: Take 1 tablet (12.5 mg total) by mouth every 6 (six) hours as needed for nausea or vomiting.    Dispense:  30 tablet    Refill:  0     CC: Kathalene Frames, MD   Subjective:   Chief Complaint: Abdominal pain diarrhea emesis  HPI 82 year old white woman with a history of bile salt diarrhea and functional dyspepsia who presents with a flare and problems.  She had an episode late last year where she became constipated.  The context was that she had an episode of nausea and felt like she was going to have a spell of nausea vomiting and diarrhea that occurs about 3 or 4 times a year, and she used one of her husbands ondansetron's.  She did not vomit and became severely constipated.  She called then and I advised that she use MiraLAX and that she hold the cholestyramine until bowel habit is returned to more normal.  She is back on that without difficulty.  She gets tearful today explaining that her husband  [name], also a patient of mine, now has a tumor in the thalamus in the brain that is inoperable.  She is losing function and house.  This is quite upsetting.  She expresses regret about moving to [facility] last year and wishes she were still in her old house.  Stomach has been off in general more so than usual with nausea and upset.  No early satiety no frank pain but discomfort.  Variable appetite.  She had been placed on Crestor for an abnormal coronary artery calcium score, and suffered with fatigue so she held or stopped that for the time being and plans to follow-up with cardiology about that at some point. Wt Readings from Last 3 Encounters:  02/23/22 156 lb 3.2 oz (70.9 kg)  11/18/21 156 lb 3.2 oz (70.9 kg)  03/23/21 158 lb (71.7 kg)   She went to the ER last year (note reviewed), in February 2023 and had an evaluation because of one of the spells where she gets nausea vomiting and diarrhea.  A CT of the abdomen pelvis and labs were negative except for some leukocytosis and slight elevation in creatinine.   Allergies  Allergen Reactions   Ace Inhibitors Other (See Comments)   Levofloxacin Other (See Comments)   Current Meds  Medication Sig   budesonide-formoterol (SYMBICORT) 160-4.5 MCG/ACT inhaler Inhale 2 puffs into the lungs 2 (two) times daily.   cholecalciferol (VITAMIN  D3) 25 MCG (1000 UT) tablet Take 1,000 Units by mouth daily.   cholestyramine (QUESTRAN) 4 GM/DOSE powder 1 scoop daily   citalopram (CELEXA) 20 MG tablet Take 20 mg by mouth daily. 1/2 tablet   clonazePAM (KLONOPIN) 0.5 MG tablet Take 0.5 mg by mouth daily.   Cyanocobalamin (VITAMIN B12 PO) Take 5,000 Units by mouth daily.   estradiol (ESTRACE) 0.5 MG tablet Take 0.5 mg by mouth daily.   levothyroxine (SYNTHROID, LEVOTHROID) 112 MCG tablet Take 112 mcg by mouth daily.   valsartan-hydrochlorothiazide (DIOVAN-HCT) 160-25 MG per tablet Take 1 tablet by mouth daily.   zolpidem (AMBIEN) 10 MG tablet Take 5 mg by  mouth daily.    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   Bile salt-induced diarrhea    Coronary artery calcification seen on CT scan 11/18/2021   CAC score 7/22: 255 (70th percentile)   Hypertension    Hypothyroidism    Thyroid disease    Past Surgical History:  Procedure Laterality Date   ABDOMINAL HYSTERECTOMY     CHOLECYSTECTOMY N/A 03/20/2014   Procedure: LAPAROSCOPIC CHOLECYSTECTOMY;  Surgeon: Doreen Salvage, MD;  Location: [facility] OR;  Service: General;  Laterality: N/A;   COLONOSCOPY     Social History   Social History Narrative   She is married to [employer] an active lifestyle   2 daughters   Retired Data processing manager work   1 alcoholic beverage daily   1 caffeinated beverage daily   Never smoker   family history includes Aneurysm in her father; Breast cancer (age of onset: 3) in her daughter; Heart disease in her father, maternal grandfather, mother, and paternal grandfather.   Review of Systems As above  Objective:   Physical Exam BP 130/64   Pulse 75   Ht '5\' 7"'$  (1.702 m)   Wt 156 lb 3.2 oz (70.9 kg)   BMI 24.46 kg/m  NAD Lungs cta Cor NL Abd soft NT no HSM/mass

## 2022-02-23 NOTE — Patient Instructions (Signed)
We have sent the following medications to your pharmacy for you to pick up at your convenience: Promethazine   You have been scheduled for an endoscopy. Please follow written instructions given to you at your visit today. If you use inhalers (even only as needed), please bring them with you on the day of your procedure.  Due to recent changes in healthcare laws, you may see the results of your imaging and laboratory studies on MyChart before your provider has had a chance to review them.  We understand that in some cases there may be results that are confusing or concerning to you. Not all laboratory results come back in the same time frame and the provider may be waiting for multiple results in order to interpret others.  Please give [place] 48 hours in order for your provider to thoroughly review all the results before contacting the office for clarification of your results.    I appreciate the opportunity to care for you. Silvano Rusk, MD, [facility]

## 2022-02-24 ENCOUNTER — Encounter: Payer: Self-pay | Admitting: Internal Medicine

## 2022-02-24 ENCOUNTER — Ambulatory Visit (AMBULATORY_SURGERY_CENTER): Payer: Medicare HMO | Admitting: Internal Medicine

## 2022-02-24 VITALS — BP 141/90 | HR 79 | Temp 98.2°F | Resp 12 | Ht 67.0 in | Wt 156.0 lb

## 2022-02-24 DIAGNOSIS — K2970 Gastritis, unspecified, without bleeding: Secondary | ICD-10-CM

## 2022-02-24 DIAGNOSIS — K2951 Unspecified chronic gastritis with bleeding: Secondary | ICD-10-CM

## 2022-02-24 DIAGNOSIS — R1013 Epigastric pain: Secondary | ICD-10-CM

## 2022-02-24 MED ORDER — OMEPRAZOLE 40 MG PO CPDR: 40.0000 mg | DELAYED_RELEASE_CAPSULE | Freq: Every day | ORAL | 0 refills | Status: DC

## 2022-02-24 MED ORDER — SODIUM CHLORIDE 0.9 % IV SOLN: 500.0000 mL | Freq: Once | INTRAVENOUS | Status: DC

## 2022-02-24 NOTE — Op Note (Signed)
[place] Patient Name: Alexis Donovan Procedure Date: 02/24/2022 1:57 PM MRN: [id number] Endoscopist: Gatha Mayer , MD, [id number] Age: 82 Referring MD:  Date of Birth: 03/24/1940 Gender: Female Account #: [id number] Procedure:                Upper GI endoscopy Indications:              Dyspepsia Medicines:                Monitored Anesthesia Care Procedure:                Pre-Anesthesia Assessment:                           - Prior to the procedure, a History and Physical                            was performed, and patient medications and                            allergies were reviewed. The patient's tolerance of                            previous anesthesia was also reviewed. The risks                            and benefits of the procedure and the sedation                            options and risks were discussed with the patient.                            All questions were answered, and informed consent                            was obtained. Prior Anticoagulants: The patient has                            taken no anticoagulant or antiplatelet agents. ASA                            Grade Assessment: II - A patient with mild systemic                            disease. After reviewing the risks and benefits,                            the patient was deemed in satisfactory condition to                            undergo the procedure.                           After obtaining informed consent, the endoscope was  passed under direct vision. Throughout the                            procedure, the patient's blood pressure, pulse, and                            oxygen saturations were monitored continuously. The                            GIF [id number] #[id number] was introduced through the                            mouth, and advanced to the second part of duodenum.                            The upper GI endoscopy was accomplished  without                            difficulty. The patient tolerated the procedure                            well. Scope In: Scope Out: Findings:                 Diffuse moderate inflammation characterized by                            congestion (edema), erosions, erythema and                            granularity + adherent bile was found in the distal                            gastric body and in the gastric antrum. Biopsies                            were taken with a cold forceps for histology.                            Verification of patient identification for the                            specimen was done. Estimated blood loss was minimal.                           A 2 cm hiatal hernia was present.                           The exam was otherwise without abnormality.                           The cardia and gastric fundus were normal on                            retroflexion. Complications:  No immediate complications. Estimated Blood Loss:     Estimated blood loss was minimal. Impression:               - Gastritis. Biopsied.                           - 2 cm hiatal hernia.                           - The examination was otherwise normal. Recommendation:           - Patient has a contact number available for                            emergencies. The signs and symptoms of potential                            delayed complications were discussed with the                            patient. Return to normal activities tomorrow.                            Written discharge instructions were provided to the                            patient.                           - Resume previous diet.                           - Continue present medications.                           - Await pathology results.                           - will start omeprazole 40 mg daily and await                            pathology. Treat H pylori if present. Gatha Mayer, MD 02/24/2022  2:23:13 PM This report has been signed electronically.

## 2022-02-24 NOTE — Patient Instructions (Addendum)
The stomach is inflamed. That is called gastritis. I took biopsies to see if there is an infection causing it.  I am starting omeprazole to help and will let you know if there is anything else needed and follow-up plans after I see pathology,  No suspicion of cancer.  I appreciate the opportunity to care for you. Gatha Mayer, MD, FACG    YOU HAD AN ENDOSCOPIC PROCEDURE TODAY AT [facility]:   Refer to the procedure report that was given to you for any specific questions about what was found during the examination.  If the procedure report does not answer your questions, please call your gastroenterologist to clarify.  If you requested that your care partner not be given the details of your procedure findings, then the procedure report has been included in a sealed envelope for you to review at your convenience later.  YOU SHOULD EXPECT: Some feelings of bloating in the abdomen. Passage of more gas than usual.  Walking can help get rid of the air that was put into your GI tract during the procedure and reduce the bloating. If you had a lower endoscopy (such as a colonoscopy or flexible sigmoidoscopy) you may notice spotting of blood in your stool or on the toilet paper. If you underwent a bowel prep for your procedure, you may not have a normal bowel movement for a few days.  Please Note:  You might notice some irritation and congestion in your nose or some drainage.  This is from the oxygen used during your procedure.  There is no need for concern and it should clear up in a day or so.  SYMPTOMS TO REPORT IMMEDIATELY:   Following upper endoscopy (EGD)  Vomiting of blood or coffee ground material  New chest pain or pain under the shoulder blades  Painful or persistently difficult swallowing  New shortness of breath  Fever of 100F or higher  Black, tarry-looking stools  For urgent or emergent issues, a gastroenterologist can be reached at any hour by calling [phone]  [phone]. Do not use MyChart messaging for urgent concerns.    DIET:  We do recommend a small meal at first, but then you may proceed to your regular diet.  Drink plenty of fluids but you should avoid alcoholic beverages for 24 hours.  ACTIVITY:  You should plan to take it easy for the rest of today and you should NOT DRIVE or use heavy machinery until tomorrow (because of the sedation medicines used during the test).    FOLLOW UP: Our staff will call the number listed on your records the next business day following your procedure.  We will call around 7:15- 8:00 am to check on you and address any questions or concerns that you may have regarding the information given to you following your procedure. If we do not reach you, we will leave a message.     If any biopsies were taken you will be contacted by phone or by letter within the next 1-3 weeks.  Please call us at [phone] if you have not heard about the biopsies in 3 weeks.    SIGNATURES/CONFIDENTIALITY: You and/or your care partner have signed paperwork which will be entered into your electronic medical record.  These signatures attest to the fact that that the information above on your After Visit Summary has been reviewed and is understood.  Full responsibility of the confidentiality of this discharge information lies with you and/or your care-partner.

## 2022-02-24 NOTE — Progress Notes (Signed)
History and Physical Interval Note:  02/24/2022 2:02 PM  Alexis Donovan  has presented today for endoscopic procedure(s), with the diagnosis of  Encounter Diagnosis  Name Primary?   Dyspepsia Yes  .  The various methods of evaluation and treatment have been discussed with the patient and/or family. After consideration of risks, benefits and other options for treatment, the patient has consented to  the endoscopic procedure(s).   The patient's history has been reviewed, patient examined, no change in status, stable for endoscopic procedure(s).  I have reviewed the patient's chart and labs.  Questions were answered to the patient's satisfaction.     Gatha Mayer, MD, [facility]

## 2022-02-24 NOTE — Progress Notes (Signed)
Called to room to assist during endoscopic procedure.  Patient ID and intended procedure confirmed with present staff. Received instructions for my participation in the procedure from the performing physician.  

## 2022-02-24 NOTE — Progress Notes (Signed)
Pt's states no medical or surgical changes since previsit or office visit. 

## 2022-02-24 NOTE — Progress Notes (Signed)
Sedate, gd SR, tolerated procedure well, VSS, report to RN 

## 2022-02-25 ENCOUNTER — Telehealth: Payer: Self-pay

## 2022-02-25 NOTE — Telephone Encounter (Signed)
  Follow up Call-     02/24/2022    1:23 PM  Call back number  Post procedure Call Back phone  # [phone]  Permission to leave phone message Yes     Patient questions:  Do you have a fever, pain , or abdominal swelling? No. Pain Score  0 *  Have you tolerated food without any problems? Yes.    Have you been able to return to your normal activities? Yes.    Do you have any questions about your discharge instructions: Diet   No. Medications  No. Follow up visit  No.  Do you have questions or concerns about your Care? No.  Actions: * If pain score is 4 or above: No action needed, pain <4.

## 2022-03-03 ENCOUNTER — Encounter: Payer: Self-pay | Admitting: Internal Medicine

## 2022-05-09 ENCOUNTER — Other Ambulatory Visit: Payer: Self-pay | Admitting: Internal Medicine

## 2022-05-10 NOTE — Telephone Encounter (Signed)
OK I signed Rx

## 2022-05-10 NOTE — Telephone Encounter (Signed)
I spoke with patient by phone regarding refill.  Patient had communicated to you in early February that she would discontinue omeprazole after she finished what she had due to the fact she didn't feel it was very effective.  Today, patient states she has been taking omeprazole 40mg  daily since then and she is doing quiet well.  She would like to continue omeprazole.    Is it OK to refill and with how many refills?  Thank you

## 2022-06-16 ENCOUNTER — Encounter: Payer: Self-pay | Admitting: Internal Medicine

## 2022-06-16 DIAGNOSIS — Z0000 Encounter for general adult medical examination without abnormal findings: Secondary | ICD-10-CM

## 2022-06-22 ENCOUNTER — Telehealth: Payer: Self-pay | Admitting: Internal Medicine

## 2022-06-22 NOTE — Telephone Encounter (Signed)
Patient called to schedule appointment. States she has been consistently vomiting and having diarrhea. Informed next appt would be in September. States she cannot wait that long. Requesting a call back to see what could be done sooner. Please advise, thank you.

## 2022-06-23 NOTE — Telephone Encounter (Signed)
Pt stated that she is having nausea at night that has become more frequent. Pt is her husbands care giver. Pt stated that she is under a lot of stress as well. Pt is requesting a sooner appointment preferably on Thursday or Friday. Pt was notified to call back Monday to see if we have any opening next week.  Pt verbalized understanding with all questions answered.

## 2022-06-28 ENCOUNTER — Other Ambulatory Visit: Payer: Self-pay

## 2022-06-28 DIAGNOSIS — D2271 Melanocytic nevi of right lower limb, including hip: Secondary | ICD-10-CM | POA: Diagnosis not present

## 2022-06-28 DIAGNOSIS — K2970 Gastritis, unspecified, without bleeding: Secondary | ICD-10-CM

## 2022-06-28 DIAGNOSIS — D2262 Melanocytic nevi of left upper limb, including shoulder: Secondary | ICD-10-CM | POA: Diagnosis not present

## 2022-06-28 DIAGNOSIS — L821 Other seborrheic keratosis: Secondary | ICD-10-CM | POA: Diagnosis not present

## 2022-06-28 DIAGNOSIS — D1801 Hemangioma of skin and subcutaneous tissue: Secondary | ICD-10-CM | POA: Diagnosis not present

## 2022-06-28 DIAGNOSIS — D2261 Melanocytic nevi of right upper limb, including shoulder: Secondary | ICD-10-CM | POA: Diagnosis not present

## 2022-06-28 DIAGNOSIS — L720 Epidermal cyst: Secondary | ICD-10-CM | POA: Diagnosis not present

## 2022-06-28 DIAGNOSIS — D2272 Melanocytic nevi of left lower limb, including hip: Secondary | ICD-10-CM | POA: Diagnosis not present

## 2022-06-28 NOTE — Telephone Encounter (Signed)
Scheduled appointment with patient for 07/05/22 at 2:00 pm with Jill Side, NP.

## 2022-07-05 ENCOUNTER — Encounter: Payer: Self-pay | Admitting: Nurse Practitioner

## 2022-07-05 ENCOUNTER — Ambulatory Visit: Payer: Medicare HMO | Admitting: Nurse Practitioner

## 2022-07-05 VITALS — BP 128/68 | HR 72 | Ht 67.0 in | Wt 149.0 lb

## 2022-07-05 DIAGNOSIS — R110 Nausea: Secondary | ICD-10-CM | POA: Diagnosis not present

## 2022-07-05 MED ORDER — ONDANSETRON HCL 4 MG PO TABS: 4.0000 mg | ORAL_TABLET | Freq: Three times a day (TID) | ORAL | 1 refills | Status: AC | PRN

## 2022-07-05 NOTE — Patient Instructions (Signed)
We have sent the following medications to your pharmacy for you to pick up at your convenience: Ondansetron 4 mg  FDGuard- take 2 by mouth twice daily for nausea & upset stomach  Due to recent changes in healthcare laws, you may see the results of your imaging and laboratory studies on MyChart before your provider has had a chance to review them.  We understand that in some cases there may be results that are confusing or concerning to you. Not all laboratory results come back in the same time frame and the provider may be waiting for multiple results in order to interpret others.  Please give [place] 48 hours in order for your provider to thoroughly review all the results before contacting the office for clarification of your results.   Thank you for trusting me with your gastrointestinal care!   Alcide Evener, CRNP

## 2022-07-05 NOTE — Progress Notes (Signed)
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 S/P cholecystectomy 02/2018.  She is followed by Dr. Leone Payor.  She presents today for further evaluation regarding nausea which she attributes to undergoing significant stress since her husband was diagnosed with terminal brain cancer.  He is now under hospice care at home.  She has a low level of nausea most days for which she sips on ginger ale.  However, she has intermittent days when she has severe nausea with stomach upset and diarrhea which is controlled after taking Phenergan but this medication can results in drowsiness.  She denies having any abdominal pain.  No GERD symptoms.  No longer taking Omeprazole.  She has lost 6 - 7 pounds the past 5 months. She underwent an EGD 02/24/2022 which showed a 2 cm hiatal hernia and gastritis, no evidence of H. pylori.  She underwent a colonoscopy 10/26/2018 which identified 2 diminutive polyps removed from the cecum and sigmoid diverticulosis.  She reported undergoing laboratory studies with her PCP at the time of her physical 07/2021 which were normal. She is scheduled for her annual physical 07/2022 and she intends to have updated laboratory studies done at that time.  GI PROCEDURES:   EGD 02/24/2022: - Gastritis. Biopsied.  - 2 cm hiatal hernia.  - The examination was otherwise normal. - ANTRAL AND OXYNTIC MUCOSAE WITH REACTIVE (CHEMICAL) GASTROPATHY AND SLIGHT CHRONIC INFLAMMATION - NEGATIVE FOR HELICOBACTER ORGANISMS ON H&E STAIN  Colonoscopy 10/26/2018: 2 diminutive polyps removed from the cecum Sigmoid diverticulosis   Current Outpatient Medications on File Prior to Visit  Medication Sig Dispense Refill    budesonide-formoterol (SYMBICORT) 160-4.5 MCG/ACT inhaler Inhale 2 puffs into the lungs 2 (two) times daily. (Patient taking differently: Inhale 2 puffs into the lungs 2 (two) times daily as needed.) 1 Inhaler 6   cholecalciferol (VITAMIN D3) 25 MCG (1000 UT) tablet Take 1,000 Units by mouth daily.     cholestyramine (QUESTRAN) 4 GM/DOSE powder 1 scoop daily 348 g 11   citalopram (CELEXA) 20 MG tablet Take 20 mg by mouth daily. 1/2 tablet     clonazePAM (KLONOPIN) 0.5 MG tablet Take 0.5 mg by mouth daily.     Cyanocobalamin (VITAMIN B12 PO) Take 5,000 Units by mouth daily.     estradiol (ESTRACE) 0.5 MG tablet Take 0.5 mg by mouth daily.     levothyroxine (SYNTHROID, LEVOTHROID) 112 MCG tablet Take 112 mcg by mouth daily.     promethazine (PHENERGAN) 12.5 MG tablet Take 1 tablet (12.5 mg total) by mouth every 6 (six) hours as needed for nausea or vomiting. 30 tablet 0   valsartan-hydrochlorothiazide (DIOVAN-HCT) 160-25 MG per tablet Take 1 tablet by mouth daily.     zolpidem (AMBIEN) 10 MG tablet Take 5 mg by mouth daily.      omeprazole (PRILOSEC) 40 MG capsule TAKE 1 CAPSULE (40 MG TOTAL) BY MOUTH DAILY. (Patient not taking: Reported on 07/05/2022) 90 capsule 0   No current facility-administered medications on file prior to visit.   Allergies  Allergen Reactions   Ace Inhibitors Other (See Comments)   Levofloxacin Other (See Comments)   Current Medications, Allergies, Past Medical History, Past Surgical History, Family History and Social History were reviewed in [employer] record.  Review of  Systems:   Constitutional: Negative for fever, sweats, chills or weight loss.  Respiratory: Negative for shortness of breath.   Cardiovascular: Negative for chest pain, palpitations and leg swelling.  Gastrointestinal: See HPI.  Musculoskeletal: Negative for back pain or muscle aches.  Neurological: Negative for dizziness, headaches or paresthesias.   Physical Exam: BP  128/68   Pulse 72   Ht 5\' 7"  (1.702 m)   Wt 149 lb (67.6 kg)   SpO2 97%   BMI 23.34 kg/m  Wt Readings from Last 3 Encounters:  07/05/22 149 lb (67.6 kg)  02/24/22 156 lb (70.8 kg)  02/23/22 156 lb 3.2 oz (70.9 kg)    General: 82 year old female in no acute distress. Head: Normocephalic and atraumatic. Eyes: No scleral icterus. Conjunctiva pink . Ears: Normal auditory acuity. Mouth: Dentition intact. No ulcers or lesions.  Lungs: Clear throughout to auscultation. Heart: Regular rate and rhythm, no murmur. Abdomen: Soft, nontender and nondistended. No masses or hepatomegaly. Normal bowel sounds x 4 quadrants.  Lower midline scar intact.  Rectal: Deferred.  Musculoskeletal: Symmetrical with no gross deformities. Extremities: No edema. Neurological: Alert oriented x 4. No focal deficits.  Psychological: Alert and cooperative. Normal mood and affect  Assessment and Recommendations:  82 year old female with low level nausea most days with intermittent flares of N/V/D which occur when stress level significantly elevated. No abdominal pain.  Phenergan results in symptom relief with undesired drowsiness.  EGD 01/2022 showed gastritis without evidence of H. pylori. -Ondansetron 4 mg ODT dissolve 1 tab on tongue every 8 hours as needed.  It was previously documented the patient had constipation after taking Ondansetron but she does not recall this being an issue and she is willing to try it at this time. She will stop Ondansetron if she develops constipation. -Ginger tea, ginger chews or ginger ale as tolerated -FDgard 2 capsules p.o. twice daily -Patient to contact office if symptoms worsen  Weight loss, likely due to above -Consider CTAP if weight loss persists

## 2022-07-23 ENCOUNTER — Other Ambulatory Visit: Payer: Self-pay | Admitting: Internal Medicine

## 2022-07-23 DIAGNOSIS — Z1231 Encounter for screening mammogram for malignant neoplasm of breast: Secondary | ICD-10-CM

## 2022-08-10 ENCOUNTER — Other Ambulatory Visit: Payer: Self-pay | Admitting: Internal Medicine

## 2022-08-13 ENCOUNTER — Ambulatory Visit
Admission: RE | Admit: 2022-08-13 | Discharge: 2022-08-13 | Disposition: A | Discharge status: Home / Self Care | Payer: Medicare HMO | Source: Ambulatory Visit | Attending: Internal Medicine | Admitting: Internal Medicine

## 2022-08-13 DIAGNOSIS — Z1231 Encounter for screening mammogram for malignant neoplasm of breast: Secondary | ICD-10-CM | POA: Diagnosis not present

## 2022-08-24 DIAGNOSIS — N760 Acute vaginitis: Secondary | ICD-10-CM | POA: Diagnosis not present

## 2022-08-24 DIAGNOSIS — N952 Postmenopausal atrophic vaginitis: Secondary | ICD-10-CM | POA: Diagnosis not present

## 2022-08-24 DIAGNOSIS — I700 Atherosclerosis of aorta: Secondary | ICD-10-CM | POA: Diagnosis not present

## 2022-11-19 DIAGNOSIS — E559 Vitamin D deficiency, unspecified: Secondary | ICD-10-CM | POA: Diagnosis not present

## 2022-11-19 DIAGNOSIS — I10 Essential (primary) hypertension: Secondary | ICD-10-CM | POA: Diagnosis not present

## 2022-11-19 DIAGNOSIS — E7800 Pure hypercholesterolemia, unspecified: Secondary | ICD-10-CM | POA: Diagnosis not present

## 2022-11-19 DIAGNOSIS — Z0000 Encounter for general adult medical examination without abnormal findings: Secondary | ICD-10-CM | POA: Diagnosis not present

## 2022-11-19 DIAGNOSIS — Z23 Encounter for immunization: Secondary | ICD-10-CM | POA: Diagnosis not present

## 2022-11-19 DIAGNOSIS — Z1331 Encounter for screening for depression: Secondary | ICD-10-CM | POA: Diagnosis not present

## 2022-11-19 DIAGNOSIS — Z79899 Other long term (current) drug therapy: Secondary | ICD-10-CM | POA: Diagnosis not present

## 2023-03-28 DIAGNOSIS — M542 Cervicalgia: Secondary | ICD-10-CM | POA: Diagnosis not present

## 2023-05-18 DIAGNOSIS — M1711 Unilateral primary osteoarthritis, right knee: Secondary | ICD-10-CM | POA: Diagnosis not present

## 2023-05-18 DIAGNOSIS — M5030 Other cervical disc degeneration, unspecified cervical region: Secondary | ICD-10-CM | POA: Diagnosis not present

## 2023-07-04 DIAGNOSIS — D485 Neoplasm of uncertain behavior of skin: Secondary | ICD-10-CM | POA: Diagnosis not present

## 2023-07-04 DIAGNOSIS — L820 Inflamed seborrheic keratosis: Secondary | ICD-10-CM | POA: Diagnosis not present

## 2023-07-04 DIAGNOSIS — D2271 Melanocytic nevi of right lower limb, including hip: Secondary | ICD-10-CM | POA: Diagnosis not present

## 2023-07-04 DIAGNOSIS — D2272 Melanocytic nevi of left lower limb, including hip: Secondary | ICD-10-CM | POA: Diagnosis not present

## 2023-07-04 DIAGNOSIS — L821 Other seborrheic keratosis: Secondary | ICD-10-CM | POA: Diagnosis not present

## 2023-07-04 DIAGNOSIS — D2262 Melanocytic nevi of left upper limb, including shoulder: Secondary | ICD-10-CM | POA: Diagnosis not present

## 2023-07-04 DIAGNOSIS — D2261 Melanocytic nevi of right upper limb, including shoulder: Secondary | ICD-10-CM | POA: Diagnosis not present

## 2023-07-04 DIAGNOSIS — L738 Other specified follicular disorders: Secondary | ICD-10-CM | POA: Diagnosis not present

## 2023-07-30 ENCOUNTER — Other Ambulatory Visit: Payer: Self-pay | Admitting: Internal Medicine

## 2023-08-12 ENCOUNTER — Other Ambulatory Visit: Payer: Self-pay | Admitting: Internal Medicine

## 2023-08-12 DIAGNOSIS — Z1231 Encounter for screening mammogram for malignant neoplasm of breast: Secondary | ICD-10-CM

## 2023-08-25 ENCOUNTER — Ambulatory Visit
Admission: RE | Admit: 2023-08-25 | Discharge: 2023-08-25 | Disposition: A | Discharge status: Home / Self Care | Source: Ambulatory Visit | Attending: Internal Medicine | Admitting: Internal Medicine

## 2023-08-25 DIAGNOSIS — Z1231 Encounter for screening mammogram for malignant neoplasm of breast: Secondary | ICD-10-CM

## 2023-08-25 HISTORY — DX: Encounter for nonprocreative screening for genetic disease carrier status: Z13.71

## 2023-11-18 DIAGNOSIS — E039 Hypothyroidism, unspecified: Secondary | ICD-10-CM | POA: Diagnosis not present

## 2023-11-18 DIAGNOSIS — Z79899 Other long term (current) drug therapy: Secondary | ICD-10-CM | POA: Diagnosis not present

## 2023-11-18 DIAGNOSIS — E559 Vitamin D deficiency, unspecified: Secondary | ICD-10-CM | POA: Diagnosis not present

## 2023-11-18 DIAGNOSIS — E7800 Pure hypercholesterolemia, unspecified: Secondary | ICD-10-CM | POA: Diagnosis not present

## 2023-11-18 DIAGNOSIS — I10 Essential (primary) hypertension: Secondary | ICD-10-CM | POA: Diagnosis not present

## 2023-12-13 DIAGNOSIS — M1611 Unilateral primary osteoarthritis, right hip: Secondary | ICD-10-CM | POA: Diagnosis not present

## 2023-12-13 DIAGNOSIS — M4307 Spondylolysis, lumbosacral region: Secondary | ICD-10-CM | POA: Diagnosis not present

## 2023-12-26 DIAGNOSIS — M4307 Spondylolysis, lumbosacral region: Secondary | ICD-10-CM | POA: Diagnosis not present

## 2023-12-26 DIAGNOSIS — M5416 Radiculopathy, lumbar region: Secondary | ICD-10-CM | POA: Diagnosis not present

## 2023-12-26 DIAGNOSIS — M1611 Unilateral primary osteoarthritis, right hip: Secondary | ICD-10-CM | POA: Diagnosis not present

## 2023-12-26 DIAGNOSIS — M545 Low back pain, unspecified: Secondary | ICD-10-CM | POA: Diagnosis not present

## 2024-01-04 DIAGNOSIS — J4520 Mild intermittent asthma, uncomplicated: Secondary | ICD-10-CM | POA: Diagnosis not present

## 2024-01-04 DIAGNOSIS — I10 Essential (primary) hypertension: Secondary | ICD-10-CM | POA: Diagnosis not present

## 2024-01-04 DIAGNOSIS — R052 Subacute cough: Secondary | ICD-10-CM | POA: Diagnosis not present

## 2024-01-10 DIAGNOSIS — M5416 Radiculopathy, lumbar region: Secondary | ICD-10-CM | POA: Diagnosis not present

## 2024-01-10 DIAGNOSIS — M4307 Spondylolysis, lumbosacral region: Secondary | ICD-10-CM | POA: Diagnosis not present

## 2024-01-10 DIAGNOSIS — M1611 Unilateral primary osteoarthritis, right hip: Secondary | ICD-10-CM | POA: Diagnosis not present

## 2024-01-10 DIAGNOSIS — M545 Low back pain, unspecified: Secondary | ICD-10-CM | POA: Diagnosis not present

## 2024-01-11 DIAGNOSIS — M545 Low back pain, unspecified: Secondary | ICD-10-CM | POA: Diagnosis not present

## 2024-01-11 DIAGNOSIS — M5416 Radiculopathy, lumbar region: Secondary | ICD-10-CM | POA: Diagnosis not present

## 2024-01-11 DIAGNOSIS — M4307 Spondylolysis, lumbosacral region: Secondary | ICD-10-CM | POA: Diagnosis not present

## 2024-01-11 DIAGNOSIS — M1611 Unilateral primary osteoarthritis, right hip: Secondary | ICD-10-CM | POA: Diagnosis not present
